# Patient Record
Sex: Male | Born: 1960 | Race: White | Hispanic: No | Marital: Married | Smoking: Never smoker
Health system: Southern US, Community
[De-identification: ages and names within clinical notes are randomized; demographics above are authoritative.]

## PROBLEM LIST (undated history)

## (undated) DIAGNOSIS — K8020 Calculus of gallbladder without cholecystitis without obstruction: Secondary | ICD-10-CM

## (undated) DIAGNOSIS — G4730 Sleep apnea, unspecified: Secondary | ICD-10-CM

## (undated) DIAGNOSIS — T7840XA Allergy, unspecified, initial encounter: Secondary | ICD-10-CM

## (undated) DIAGNOSIS — E785 Hyperlipidemia, unspecified: Secondary | ICD-10-CM

## (undated) HISTORY — DX: Hyperlipidemia, unspecified: E78.5

## (undated) HISTORY — PX: COLONOSCOPY W/ POLYPECTOMY: SHX1380

## (undated) HISTORY — DX: Allergy, unspecified, initial encounter: T78.40XA

## (undated) HISTORY — PX: COLONOSCOPY: SHX174

## (undated) HISTORY — DX: Sleep apnea, unspecified: G47.30

---

## 2008-11-06 ENCOUNTER — Ambulatory Visit: Payer: Self-pay | Admitting: Internal Medicine

## 2008-11-06 DIAGNOSIS — K921 Melena: Secondary | ICD-10-CM

## 2008-11-06 LAB — CONVERTED CEMR LAB
ALT: 17 units/L (ref 0–53)
Alkaline Phosphatase: 33 units/L — ABNORMAL LOW (ref 39–117)
Basophils Relative: 0.2 % (ref 0.0–3.0)
Bilirubin, Direct: 0 mg/dL (ref 0.0–0.3)
Calcium: 9.3 mg/dL (ref 8.4–10.5)
Chloride: 104 meq/L (ref 96–112)
Creatinine, Ser: 1.2 mg/dL (ref 0.4–1.5)
Eosinophils Relative: 1.4 % (ref 0.0–5.0)
Lymphocytes Relative: 20.4 % (ref 12.0–46.0)
Monocytes Relative: 6.5 % (ref 3.0–12.0)
Neutrophils Relative %: 71.5 % (ref 43.0–77.0)
PSA: 0.69 ng/mL (ref 0.10–4.00)
RBC: 4.86 M/uL (ref 4.22–5.81)
Total Protein: 7.2 g/dL (ref 6.0–8.3)
WBC: 5.6 10*3/uL (ref 4.5–10.5)

## 2008-11-19 ENCOUNTER — Ambulatory Visit: Payer: Self-pay | Admitting: Gastroenterology

## 2008-12-08 ENCOUNTER — Ambulatory Visit: Payer: Self-pay | Admitting: Internal Medicine

## 2008-12-08 ENCOUNTER — Encounter: Payer: Self-pay | Admitting: Internal Medicine

## 2008-12-10 ENCOUNTER — Encounter: Payer: Self-pay | Admitting: Internal Medicine

## 2012-12-25 ENCOUNTER — Other Ambulatory Visit: Payer: Self-pay | Admitting: Nurse Practitioner

## 2012-12-25 ENCOUNTER — Ambulatory Visit
Admission: RE | Admit: 2012-12-25 | Discharge: 2012-12-25 | Disposition: A | Discharge status: Home / Self Care | Payer: BC Managed Care – PPO | Source: Ambulatory Visit | Attending: Nurse Practitioner | Admitting: Nurse Practitioner

## 2012-12-25 DIAGNOSIS — M79671 Pain in right foot: Secondary | ICD-10-CM

## 2013-10-23 ENCOUNTER — Encounter: Payer: Self-pay | Admitting: Internal Medicine

## 2013-12-18 ENCOUNTER — Other Ambulatory Visit (INDEPENDENT_AMBULATORY_CARE_PROVIDER_SITE_OTHER): Payer: BC Managed Care – PPO

## 2013-12-18 DIAGNOSIS — Z0000 Encounter for general adult medical examination without abnormal findings: Secondary | ICD-10-CM

## 2013-12-18 LAB — BASIC METABOLIC PANEL
BUN: 22 mg/dL (ref 6–23)
CALCIUM: 10 mg/dL (ref 8.4–10.5)
CO2: 30 mEq/L (ref 19–32)
Chloride: 104 mEq/L (ref 96–112)
Creatinine, Ser: 1.2 mg/dL (ref 0.4–1.5)
GFR: 68.61 mL/min (ref >60.00)
GLUCOSE: 97 mg/dL (ref 70–99)
POTASSIUM: 5 meq/L (ref 3.5–5.1)
SODIUM: 141 meq/L (ref 135–145)

## 2013-12-18 LAB — POCT URINALYSIS DIPSTICK
Bilirubin, UA: NEGATIVE
Glucose, UA: NEGATIVE
Ketones, UA: NEGATIVE
Leukocytes, UA: NEGATIVE
NITRITE UA: NEGATIVE
RBC UA: NEGATIVE
Spec Grav, UA: 1.02
UROBILINOGEN UA: 0.2
pH, UA: 7.5

## 2013-12-18 LAB — CBC WITH DIFFERENTIAL/PLATELET
BASOS ABS: 0 10*3/uL (ref 0.0–0.1)
Basophils Relative: 0.7 % (ref 0.0–3.0)
EOS PCT: 3.1 % (ref 0.0–5.0)
Eosinophils Absolute: 0.2 10*3/uL (ref 0.0–0.7)
HCT: 48.7 % (ref 39.0–52.0)
HEMOGLOBIN: 16.4 g/dL (ref 13.0–17.0)
LYMPHS PCT: 24.2 % (ref 12.0–46.0)
Lymphs Abs: 1.4 10*3/uL (ref 0.7–4.0)
MCHC: 33.7 g/dL (ref 30.0–36.0)
MCV: 89 fl (ref 78.0–100.0)
MONOS PCT: 8.5 % (ref 3.0–12.0)
Monocytes Absolute: 0.5 10*3/uL (ref 0.1–1.0)
NEUTROS ABS: 3.8 10*3/uL (ref 1.4–7.7)
Neutrophils Relative %: 63.5 % (ref 43.0–77.0)
PLATELETS: 202 10*3/uL (ref 150.0–400.0)
RBC: 5.48 Mil/uL (ref 4.22–5.81)
RDW: 13.8 % (ref 11.5–15.5)
WBC: 6 10*3/uL (ref 4.0–10.5)

## 2013-12-18 LAB — TSH: TSH: 1.33 u[IU]/mL (ref 0.35–4.50)

## 2013-12-18 LAB — HEPATIC FUNCTION PANEL
ALK PHOS: 37 U/L — AB (ref 39–117)
ALT: 23 U/L (ref 0–53)
AST: 32 U/L (ref 0–37)
Albumin: 4.9 g/dL (ref 3.5–5.2)
BILIRUBIN TOTAL: 0.8 mg/dL (ref 0.2–1.2)
Bilirubin, Direct: 0.1 mg/dL (ref 0.0–0.3)
Total Protein: 7.4 g/dL (ref 6.0–8.3)

## 2013-12-18 LAB — LIPID PANEL
CHOL/HDL RATIO: 5
Cholesterol: 201 mg/dL — ABNORMAL HIGH (ref 0–200)
HDL: 43.6 mg/dL (ref >39.00)
LDL CALC: 136 mg/dL — AB (ref 0–99)
NonHDL: 157.4
Triglycerides: 108 mg/dL (ref 0.0–149.0)
VLDL: 21.6 mg/dL (ref 0.0–40.0)

## 2013-12-18 LAB — PSA: PSA: 1.06 ng/mL (ref 0.10–4.00)

## 2013-12-25 ENCOUNTER — Ambulatory Visit (INDEPENDENT_AMBULATORY_CARE_PROVIDER_SITE_OTHER): Payer: BC Managed Care – PPO | Admitting: Internal Medicine

## 2013-12-25 ENCOUNTER — Encounter: Payer: Self-pay | Admitting: Internal Medicine

## 2013-12-25 VITALS — BP 132/80 | HR 72 | Temp 97.9°F | Ht 70.5 in | Wt 201.0 lb

## 2013-12-25 DIAGNOSIS — R030 Elevated blood-pressure reading, without diagnosis of hypertension: Secondary | ICD-10-CM | POA: Insufficient documentation

## 2013-12-25 DIAGNOSIS — Z23 Encounter for immunization: Secondary | ICD-10-CM

## 2013-12-25 DIAGNOSIS — Z0000 Encounter for general adult medical examination without abnormal findings: Secondary | ICD-10-CM | POA: Insufficient documentation

## 2013-12-25 DIAGNOSIS — L989 Disorder of the skin and subcutaneous tissue, unspecified: Secondary | ICD-10-CM | POA: Insufficient documentation

## 2013-12-25 NOTE — Assessment & Plan Note (Addendum)
Reviewed adult health maintenance protocols.  Patient has history of colon polyps. Previous colonoscopy performed by Dr. Henrene Pastor. He is due for surveillance colonoscopy this year. Patient updated with Tdap.  Patient to inquire with his insurance company about shingles vaccine.  We discussed pros and cons of prostate cancer screening. His digital rectal exam was unremarkable other than mild enlargement of his prostate gland.  Repeat screening in 2 years. Lab Results  Component Value Date   PSA 1.06 12/18/2013   PSA 0.69 11/06/2008     Patient encouraged to follow low saturated fat diet. Handout provided.  Also encouraged mild weight loss.

## 2013-12-25 NOTE — Assessment & Plan Note (Signed)
Patient's blood pressure in right arm with manual cuff is 140/80 and 132/80 in left arm. Patient advised to continue monitoring blood pressure at home. I encouraged low salt diet. Patient already exercises on a regular basis. BP: 132/80 mmHg

## 2013-12-25 NOTE — Assessment & Plan Note (Signed)
Patient has history of chronic sun exposure.  He is a runner.  He has a hypopigmented skin lesion on left cheek. Refer to dermatology for further evaluation.

## 2013-12-25 NOTE — Patient Instructions (Addendum)
Monitor your blood pressure at home as directed and bring blood pressure log to your next follow up appointment. See attached low saturated fat diet handout. Ask your insurance company about shingles vaccine

## 2013-12-25 NOTE — Progress Notes (Signed)
Subjective:    Patient ID: Aaron Donovan, male    DOB: [birth year], 53 y.o.   MRN: [id number]  HPI  53 year old white male for routine physical. Patient denies any significant interval medical history. His last colonoscopy was in 2010 with Dr. Henrene Pastor. Patient scheduled to have followup colonoscopy this year.  Patient reports he has been occasionally monitoring his blood pressure at his local pharmacy. His systolic blood pressure has frequently been elevated above 140. He is asymptomatic.  Screening bloodwork reviewed with patient in detail.  Review of Systems  Constitutional: Negative for activity change, appetite change and unexpected weight change.  Eyes: Negative for visual disturbance.  Ears: no hearing loss Respiratory: Negative for cough, chest tightness and shortness of breath.   Cardiovascular: Negative for chest pain.  Genitourinary: Negative for difficulty urinating. nocturia x 1 Neurological: Negative for headaches.  Gastrointestinal: Negative for abdominal pain, heartburn melena or hematochezia Psych: Negative for depression or anxiety Endo:  Negative for ED or sexual dysfunction       History reviewed. No pertinent past medical history.  History   Social History  . Marital Status: Married    Spouse Name: N/A    Number of Children: N/A  . Years of Education: N/A   Occupational History  .      Controller at [employer]   Social History Main Topics  . Smoking status: Never Smoker   . Smokeless tobacco: Not on file  . Alcohol Use: 1.0 oz/week    2 drink(s) per week  . Drug Use: No  . Sexual Activity: Not on file   Other Topics Concern  . Not on file   Social History Narrative   Teacher, music, B.S. Degree   3 children (2 daugthers 55, 60.  1 son 43)    History reviewed. No pertinent past surgical history.  Family History  Problem Relation Age of Onset  . Rheumatic fever Father 26    Died from complications of rheumatic valvular heart disease  .  Diabetes Mellitus II Mother   . Hypertension Mother   . Obesity Brother   . Diabetes Mellitus II Sister   . Hypertension Sister   . Colon cancer Neg Hx   . Prostate cancer Neg Hx     Allergies no known allergies  No current outpatient prescriptions on file prior to visit.   No current facility-administered medications on file prior to visit.    BP 132/80  Pulse 72  Temp(Src) 97.9 F (36.6 C) (Oral)  Ht 5' 10.5" (1.791 m)  Wt 201 lb (91.173 kg)  BMI 28.42 kg/m2      Objective:   Physical Exam  Constitutional: He is oriented to person, place, and time. He appears well-developed and well-nourished. No distress.  HENT:  Head: Normocephalic and atraumatic.  Right Ear: External ear normal.  Left Ear: External ear normal.  Mouth/Throat: Oropharynx is clear and moist.  Hearing is grossly normal  Eyes: EOM are normal. Pupils are equal, round, and reactive to light. No scleral icterus.  Neck: Normal range of motion. Neck supple. No thyromegaly present.  No carotid bruit  Cardiovascular: Normal rate, regular rhythm, normal heart sounds and intact distal pulses.   No murmur heard. Pulmonary/Chest: Effort normal and breath sounds normal. He has no wheezes.  Abdominal: Soft. Bowel sounds are normal. There is no tenderness.  Genitourinary: Rectum normal.  Slightly enlarged prostate without nodules or asymmetry  Musculoskeletal: Normal range of motion. He exhibits no edema.  Lymphadenopathy:  He has no cervical adenopathy.  Neurological: He is alert and oriented to person, place, and time. No cranial nerve deficit.  Skin: Skin is warm and dry.  3-4 mm hypopigmented skin lesion left cheek  Psychiatric: He has a normal mood and affect. His behavior is normal.          Assessment & Plan:

## 2014-04-29 ENCOUNTER — Other Ambulatory Visit: Payer: Self-pay | Admitting: Dermatology

## 2014-10-16 ENCOUNTER — Encounter: Payer: Self-pay | Admitting: Internal Medicine

## 2014-11-27 ENCOUNTER — Encounter: Payer: Self-pay | Admitting: Internal Medicine

## 2015-08-13 ENCOUNTER — Encounter: Payer: Self-pay | Admitting: Internal Medicine

## 2015-10-09 ENCOUNTER — Ambulatory Visit (AMBULATORY_SURGERY_CENTER): Payer: Self-pay

## 2015-10-09 VITALS — Ht 72.0 in | Wt 206.8 lb

## 2015-10-09 DIAGNOSIS — Z860100 Personal history of colon polyps, unspecified: Secondary | ICD-10-CM

## 2015-10-09 MED ORDER — SUPREP BOWEL PREP KIT 17.5-3.13-1.6 GM/177ML PO SOLN: 1.0000 | Freq: Once | ORAL | Status: DC

## 2015-10-09 NOTE — Progress Notes (Signed)
No allergies to eggs or soy No home oxygen No past problems with anesthesia No diet/weight loss meds  Has email and internet; refused emmi; has had test before

## 2015-10-14 ENCOUNTER — Telehealth: Payer: Self-pay | Admitting: Internal Medicine

## 2015-10-14 NOTE — Telephone Encounter (Signed)
Information called to pharmacy [phone]   Matalyn Nawaz/PV

## 2015-10-23 ENCOUNTER — Encounter: Payer: Self-pay | Admitting: Internal Medicine

## 2015-10-23 ENCOUNTER — Ambulatory Visit (AMBULATORY_SURGERY_CENTER): Payer: BLUE CROSS/BLUE SHIELD | Admitting: Internal Medicine

## 2015-10-23 VITALS — BP 119/72 | HR 52 | Temp 98.7°F | Resp 17 | Ht 72.0 in | Wt 206.0 lb

## 2015-10-23 DIAGNOSIS — Z86010 Personal history of colonic polyps: Secondary | ICD-10-CM | POA: Diagnosis not present

## 2015-10-23 DIAGNOSIS — K621 Rectal polyp: Secondary | ICD-10-CM

## 2015-10-23 DIAGNOSIS — D128 Benign neoplasm of rectum: Secondary | ICD-10-CM | POA: Diagnosis not present

## 2015-10-23 MED ORDER — SODIUM CHLORIDE 0.9 % IV SOLN
500.0000 mL | INTRAVENOUS | Status: DC
Start: 2015-10-23 — End: 2015-10-23

## 2015-10-23 NOTE — Progress Notes (Signed)
Called to room to assist during endoscopic procedure.  Patient ID and intended procedure confirmed with present staff. Received instructions for my participation in the procedure from the performing physician.  

## 2015-10-23 NOTE — Progress Notes (Signed)
  [facility] Post-op Note  Patient: Aaron Donovan  Procedure(s) Performed: colonoscopy  Patient Location: [facility] - Recovery Area  Anesthesia Type: Deep Sedation/Propofol  Level of Consciousness: awake, oriented and patient cooperative  Airway and Oxygen Therapy: Patient Spontanous Breathing  Post-op Pain: none  Post-op Assessment:  Post-op Vital signs reviewed, Patient's Cardiovascular Status Stable, Respiratory Function Stable, Patent Airway, No signs of Nausea or vomiting and Pain level controlled  Post-op Vital Signs: Reviewed and stable  Complications: No apparent anesthesia complications  Nomie Buchberger E 11:54 AM

## 2015-10-23 NOTE — Op Note (Signed)
Aaron Donovan: Aaron Donovan Procedure Date: 10/23/2015 11:21 AM MRN: [id number] Endoscopist: Docia Chuck. Aaron Donovan , MD Age: 55 Date of Birth: 12-08-1960 Gender: Male Procedure:                Colonoscopy, with snare polypectomy x 2 Indications:              High risk colon cancer surveillance: Personal                            history of non-advanced adenoma. Index examination                            June 2010 with 2 diminutive adenomas Medicines:                Monitored Anesthesia Care Procedure:                Pre-Anesthesia Assessment:                           - Prior to the procedure, a History and Physical                            was performed, and patient medications and                            allergies were reviewed. The patient's tolerance of                            previous anesthesia was also reviewed. The risks                            and benefits of the procedure and the sedation                            options and risks were discussed with the patient.                            All questions were answered, and informed consent                            was obtained. Prior Anticoagulants: The patient has                            taken no previous anticoagulant or antiplatelet                            agents. ASA Grade Assessment: II - A patient with                            mild systemic disease. After reviewing the risks                            and benefits, the patient was deemed in  satisfactory condition to undergo the procedure.                           After obtaining informed consent, the colonoscope                            was passed under direct vision. Throughout the                            procedure, the patient's blood pressure, pulse, and                            oxygen saturations were monitored continuously. The                            Model CF-HQ190L [id number]) scope  was introduced                            through the anus and advanced to the the cecum,                            identified by appendiceal orifice and ileocecal                            valve. The ileocecal valve, appendiceal orifice,                            and rectum were photographed. The quality of the                            bowel preparation was excellent. The colonoscopy                            was performed without difficulty. The patient                            tolerated the procedure well. The bowel preparation                            used was SUPREP. Scope In: 11:34:15 AM Scope Out: 11:48:38 AM Scope Withdrawal Time: 0 hours 12 minutes 49 seconds  Total Procedure Duration: 0 hours 14 minutes 23 seconds  Findings:                 Three polyps were found in the rectum. The polyps                            were 2 to 3 mm in size. These polyps were removed                            with a cold snare. Resection and retrieval were                            complete.  Internal hemorrhoids were found during retroflexion.                           The exam was otherwise without abnormality on                            direct and retroflexion views. Complications:            No immediate complications. Estimated blood loss:                            None. Estimated Blood Loss:     Estimated blood loss: none. Impression:               - Three 2 to 3 mm polyps in the rectum, removed                            with a cold snare. Resected and retrieved.                           - Internal hemorrhoids.                           - The examination was otherwise normal on direct                            and retroflexion views. Recommendation:           - Repeat colonoscopy in 5 years for surveillance.                           - Patient has a contact number available for                            emergencies. The signs and symptoms of  potential                            delayed complications were discussed with the                            patient. Return to normal activities tomorrow.                            Written discharge instructions were provided to the                            patient.                           - Resume previous diet.                           - Continue present medications.                           - Await pathology results. Docia Chuck. Aaron Pastor, MD 10/23/2015 11:54:14 AM This report has been signed electronically. CC  Letter to:             Doe-hyun R. Shawna Orleans

## 2015-10-23 NOTE — Patient Instructions (Addendum)
YOU HAD AN ENDOSCOPIC PROCEDURE TODAY AT [facility]:   Refer to the procedure report that was given to you for any specific questions about what was found during the examination.  If the procedure report does not answer your questions, please call your gastroenterologist to clarify.  If you requested that your care partner not be given the details of your procedure findings, then the procedure report has been included in a sealed envelope for you to review at your convenience later.  YOU SHOULD EXPECT: Some feelings of bloating in the abdomen. Passage of more gas than usual.  Walking can help get rid of the air that was put into your GI tract during the procedure and reduce the bloating. If you had a lower endoscopy (such as a colonoscopy or flexible sigmoidoscopy) you may notice spotting of blood in your stool or on the toilet paper. If you underwent a bowel prep for your procedure, you may not have a normal bowel movement for a few days.  Please Note:  You might notice some irritation and congestion in your nose or some drainage.  This is from the oxygen used during your procedure.  There is no need for concern and it should clear up in a day or so.  SYMPTOMS TO REPORT IMMEDIATELY:   Following lower endoscopy (colonoscopy or flexible sigmoidoscopy):  Excessive amounts of blood in the stool  Significant tenderness or worsening of abdominal pains  Swelling of the abdomen that is new, acute  Fever of 100F or higher  For urgent or emergent issues, a gastroenterologist can be reached at any hour by calling [phone].   DIET: Your first meal following the procedure should be a small meal and then it is ok to progress to your normal diet. Heavy or fried foods are harder to digest and may make you feel nauseous or bloated.  Likewise, meals heavy in dairy and vegetables can increase bloating.  Drink plenty of fluids but you should avoid alcoholic beverages for 24  hours.  ACTIVITY:  You should plan to take it easy for the rest of today and you should NOT DRIVE or use heavy machinery until tomorrow (because of the sedation medicines used during the test).    FOLLOW UP: Our staff will call the number listed on your records the next business day following your procedure to check on you and address any questions or concerns that you may have regarding the information given to you following your procedure. If we do not reach you, we will leave a message.  However, if you are feeling well and you are not experiencing any problems, there is no need to return our call.  We will assume that you have returned to your regular daily activities without incident.  If any biopsies were taken you will be contacted by phone or by letter within the next 1-3 weeks.  Please call us at [phone] if you have not heard about the biopsies in 3 weeks.    SIGNATURES/CONFIDENTIALITY: You and/or your care partner have signed paperwork which will be entered into your electronic medical record.  These signatures attest to the fact that that the information above on your After Visit Summary has been reviewed and is understood.  Full responsibility of the confidentiality of this discharge information lies with you and/or your care-partner.  Please review polyp and hemorrhoid handouts provided. Await pathology results. Next colonoscopy in 5 years.

## 2015-10-26 ENCOUNTER — Telehealth: Payer: Self-pay | Admitting: *Deleted

## 2015-10-26 NOTE — Telephone Encounter (Signed)
  Follow up Call-  Call back number 10/23/2015  Post procedure Call Back phone  # [phone]  Permission to leave phone message Yes     Patient questions:  Do you have a fever, pain , or abdominal swelling? No. Pain Score  0 *  Have you tolerated food without any problems? Yes.    Have you been able to return to your normal activities? Yes.    Do you have any questions about your discharge instructions: Diet   No. Medications  No. Follow up visit  No.  Do you have questions or concerns about your Care? No.  Actions: * If pain score is 4 or above: No action needed, pain <4.

## 2015-11-06 ENCOUNTER — Encounter: Payer: Self-pay | Admitting: Internal Medicine

## 2019-04-03 ENCOUNTER — Encounter (HOSPITAL_COMMUNITY): Payer: Self-pay | Admitting: Emergency Medicine

## 2019-04-03 ENCOUNTER — Emergency Department (HOSPITAL_COMMUNITY): Payer: BC Managed Care – PPO

## 2019-04-03 ENCOUNTER — Emergency Department (HOSPITAL_COMMUNITY)
Admission: EM | Admit: 2019-04-03 | Discharge: 2019-04-03 | Discharge status: Against Medical Advice | Payer: BC Managed Care – PPO | Attending: Emergency Medicine | Admitting: Emergency Medicine

## 2019-04-03 ENCOUNTER — Other Ambulatory Visit: Payer: Self-pay

## 2019-04-03 DIAGNOSIS — R1013 Epigastric pain: Secondary | ICD-10-CM | POA: Diagnosis present

## 2019-04-03 DIAGNOSIS — Z5321 Procedure and treatment not carried out due to patient leaving prior to being seen by health care provider: Secondary | ICD-10-CM | POA: Diagnosis not present

## 2019-04-03 LAB — CBC
HCT: 46.1 % (ref 39.0–52.0)
Hemoglobin: 15.5 g/dL (ref 13.0–17.0)
MCH: 29.9 pg (ref 26.0–34.0)
MCHC: 33.6 g/dL (ref 30.0–36.0)
MCV: 88.8 fL (ref 80.0–100.0)
Platelets: 216 10*3/uL (ref 150–400)
RBC: 5.19 MIL/uL (ref 4.22–5.81)
RDW: 12.9 % (ref 11.5–15.5)
WBC: 10.6 10*3/uL — ABNORMAL HIGH (ref 4.0–10.5)
nRBC: 0 % (ref 0.0–0.2)

## 2019-04-03 LAB — COMPREHENSIVE METABOLIC PANEL
ALT: 33 U/L (ref 0–44)
AST: 34 U/L (ref 15–41)
Albumin: 4.2 g/dL (ref 3.5–5.0)
Alkaline Phosphatase: 39 U/L (ref 38–126)
Anion gap: 13 (ref 5–15)
BUN: 16 mg/dL (ref 6–20)
CO2: 23 mmol/L (ref 22–32)
Calcium: 9.3 mg/dL (ref 8.9–10.3)
Chloride: 100 mmol/L (ref 98–111)
Creatinine, Ser: 1.28 mg/dL — ABNORMAL HIGH (ref 0.61–1.24)
GFR calc Af Amer: >60 mL/min (ref >60)
GFR calc non Af Amer: >60 mL/min (ref >60)
Glucose, Bld: 131 mg/dL — ABNORMAL HIGH (ref 70–99)
Potassium: 4.1 mmol/L (ref 3.5–5.1)
Sodium: 136 mmol/L (ref 135–145)
Total Bilirubin: 0.6 mg/dL (ref 0.3–1.2)
Total Protein: 7 g/dL (ref 6.5–8.1)

## 2019-04-03 LAB — LIPASE, BLOOD: Lipase: 25 U/L (ref 11–51)

## 2019-04-03 LAB — TROPONIN I (HIGH SENSITIVITY): Troponin I (High Sensitivity): 4 ng/L (ref <18)

## 2019-04-03 MED ORDER — SODIUM CHLORIDE 0.9% FLUSH: 3.0000 mL | Freq: Once | INTRAVENOUS | Status: DC

## 2019-04-03 NOTE — ED Notes (Signed)
Registration informed the tech that this pt left [facility]

## 2019-04-03 NOTE — ED Triage Notes (Addendum)
Pt complaining of sharp mid upper abdominal pain that started at 9:30PM tonight.Pt denies N&V.

## 2019-04-04 ENCOUNTER — Other Ambulatory Visit: Payer: Self-pay | Admitting: Obstetrics and Gynecology

## 2019-04-04 DIAGNOSIS — R1013 Epigastric pain: Secondary | ICD-10-CM

## 2019-04-10 ENCOUNTER — Ambulatory Visit
Admission: RE | Admit: 2019-04-10 | Discharge: 2019-04-10 | Disposition: A | Discharge status: Home / Self Care | Payer: BC Managed Care – PPO | Source: Ambulatory Visit | Attending: Obstetrics and Gynecology | Admitting: Obstetrics and Gynecology

## 2019-04-10 DIAGNOSIS — R1013 Epigastric pain: Secondary | ICD-10-CM

## 2019-05-02 ENCOUNTER — Other Ambulatory Visit: Payer: Self-pay

## 2019-05-02 ENCOUNTER — Ambulatory Visit: Payer: BC Managed Care – PPO | Admitting: Internal Medicine

## 2019-05-02 ENCOUNTER — Encounter: Payer: Self-pay | Admitting: Internal Medicine

## 2019-05-02 VITALS — BP 124/68 | HR 73 | Temp 97.9°F | Ht 72.0 in | Wt 209.0 lb

## 2019-05-02 DIAGNOSIS — K8020 Calculus of gallbladder without cholecystitis without obstruction: Secondary | ICD-10-CM | POA: Diagnosis not present

## 2019-05-02 DIAGNOSIS — R1013 Epigastric pain: Secondary | ICD-10-CM

## 2019-05-02 NOTE — Patient Instructions (Signed)
You will receive a call from [facility] to schedule a consultation

## 2019-05-02 NOTE — Progress Notes (Signed)
HISTORY OF PRESENT ILLNESS:  Aaron Donovan is a pleasant 58 y.o. male with a history of adenomatous colon polyps for which she is undergone prior colonoscopy June 2010 and most recently April 2017.  He presents today for evaluation of acute epigastric pain and abnormal ultrasound imaging revealing cholelithiasis.  Patient reports that he was in his usual state of health until April 03, 2019 when he developed severe acute epigastric pain in the evening several hours after eating chili.  He describes the pain as sharp with difficulties getting comfortable.  Because of the persistent nature he presented to [facility] emergency room.  Cardiac etiologies were ruled out.  Comprehensive metabolic panel was unremarkable with normal liver tests and normal lipase.  CBC revealed mildly elevated white blood cell count of 10.6.  Otherwise normal.  Because of the extensive weight, patient was discharged home with plans for outpatient ultrasound.  Overall his pain lasted about 8 hours.  He tells me that he has had no issues since.  He does report having had a similar episode about 4 years ago.  That episode lasted 3 hours.  Abdominal ultrasound was completed April 10, 2019.  Patient was found to have a 2.1 cm gallstone with a slightly thickened and irregular gallbladder wall.  Normal common bile duct at 2.1 mm.  Patient denies reflux symptoms or dysphagia.  Good appetite.  No weight loss.  No nausea or vomiting  REVIEW OF SYSTEMS:  All non-GI ROS negative unless otherwise stated in the HPI except for sinus allergies  History reviewed. No pertinent past medical history.  Past Surgical History:  Procedure Laterality Date  . COLONOSCOPY    . COLONOSCOPY W/ POLYPECTOMY      Social History Aaron Donovan  reports that he has never smoked. He has never used smokeless tobacco. He reports current alcohol use of about 2.0 standard drinks of alcohol per week. He reports that he does not use drugs.  family history includes  Diabetes Mellitus II in his mother and sister; Hypertension in his mother and sister; Obesity in his brother; Rheumatic fever (age of onset: 46) in his father.  No Known Allergies     PHYSICAL EXAMINATION: Vital signs: BP 124/68   Pulse 73   Temp 97.9 F (36.6 C)   Ht 6' (1.829 m)   Wt 209 lb (94.8 kg)   BMI 28.35 kg/m   Constitutional: generally well-appearing, no acute distress Psychiatric: alert and oriented x3, cooperative Eyes: extraocular movements intact, anicteric, conjunctiva pink Mouth: oral pharynx moist, no lesions Neck: supple no lymphadenopathy Cardiovascular: heart regular rate and rhythm, no murmur Lungs: clear to auscultation bilaterally Abdomen: soft, nontender, nondistended, no obvious ascites, no peritoneal signs, normal bowel sounds, no organomegaly Rectal: Omitted Extremities: no clubbing, cyanosis, or lower extremity edema bilaterally Skin: no lesions on visible extremities Neuro: No focal deficits. No asterixis.    ASSESSMENT:  1.  Symptomatic cholelithiasis.  Now asymptomatic.  I reviewed the anatomy and pathophysiology of symptomatic gallstones with the patient.  I informed him that the definitive treatment is surgery. 2.  History of adenomatous colon polyps.  Surveillance up-to-date   PLAN:  1.  General surgical referral to Dr. Armandina Gemma for consideration of laparoscopic cholecystectomy. 2.  Due for routine surveillance colonoscopy around April 2022.  Interval follow-up as needed

## 2019-05-28 ENCOUNTER — Ambulatory Visit: Payer: Self-pay | Admitting: Surgery

## 2019-08-11 ENCOUNTER — Encounter (HOSPITAL_COMMUNITY): Payer: Self-pay | Admitting: Surgery

## 2019-08-11 DIAGNOSIS — K8010 Calculus of gallbladder with chronic cholecystitis without obstruction: Secondary | ICD-10-CM | POA: Diagnosis present

## 2019-08-11 NOTE — H&P (Signed)
General Surgery [facility]  Aaron Donovan DOB: 09-28-1960 Married / Language: English / Race: White Male   History of Present Illness   The patient is a 59 year old male who presents for evaluation of gall stones.  CHIEF COMPLAINT: chronic cholecystitis, cholelithiasis, biliary colic  Patient is referred by Dr. Scarlette Shorts for surgical evaluation and management of symptomatic cholelithiasis and chronic cholecystitis. Patient had a significant episode of epigastric abdominal pain in September 2020. He was evaluated in the emergency department. Cardiac assessment was negative. Patient had had a previous episode approximately 3 months earlier. Patient underwent ultrasound examination on April 10, 2019. This showed a 2.1 cm gallstone within the gallbladder. Gallbladder wall was irregular and slightly thickened. There was no signs of acute inflammation or infection. Patient has had no prior history of hepatobiliary or pancreatic disease. He denies jaundice or acholic stools. He has had nausea associated with his attacks but no emesis. He has had no prior abdominal surgery. There is a family history of gallbladder disease and the patient's mother and his sister. Both of them required cholecystectomy. Patient presents today to discuss cholecystectomy. He works at the [employer].   Past Surgical History  No pertinent past surgical history   Diagnostic Studies History Colonoscopy  1-5 years ago  Allergies No Known Drug Allergies  Allergies Reconciled   Medication History No Current Medications Medications Reconciled  Social History Alcohol use  Moderate alcohol use. Caffeine use  Coffee. No drug use  Tobacco use  Never smoker.  Family History Diabetes Mellitus  Mother, Sister. Heart Disease  Father. Heart disease in male family member before age 51   Other Problems  Cholelithiasis   Review of Systems General Not  Present- Appetite Loss, Chills, Fatigue, Fever, Night Sweats, Weight Gain and Weight Loss. Skin Not Present- Change in Wart/Mole, Dryness, Hives, Jaundice, New Lesions, Non-Healing Wounds, Rash and Ulcer. HEENT Not Present- Earache, Hearing Loss, Hoarseness, Nose Bleed, Oral Ulcers, Ringing in the Ears, Seasonal Allergies, Sinus Pain, Sore Throat, Visual Disturbances, Wears glasses/contact lenses and Yellow Eyes. Respiratory Present- Snoring. Not Present- Bloody sputum, Chronic Cough, Difficulty Breathing and Wheezing. Breast Not Present- Breast Mass, Breast Pain, Nipple Discharge and Skin Changes. Cardiovascular Not Present- Chest Pain, Difficulty Breathing Lying Down, Leg Cramps, Palpitations, Rapid Heart Rate, Shortness of Breath and Swelling of Extremities. Gastrointestinal Not Present- Abdominal Pain, Bloating, Bloody Stool, Change in Bowel Habits, Chronic diarrhea, Constipation, Difficulty Swallowing, Excessive gas, Gets full quickly at meals, Hemorrhoids, Indigestion, Nausea, Rectal Pain and Vomiting. Male Genitourinary Not Present- Blood in Urine, Change in Urinary Stream, Frequency, Impotence, Nocturia, Painful Urination, Urgency and Urine Leakage. Musculoskeletal Not Present- Back Pain, Joint Pain, Joint Stiffness, Muscle Pain, Muscle Weakness and Swelling of Extremities. Neurological Not Present- Decreased Memory, Fainting, Headaches, Numbness, Seizures, Tingling, Tremor, Trouble walking and Weakness. Psychiatric Not Present- Anxiety, Bipolar, Change in Sleep Pattern, Depression, Fearful and Frequent crying. Endocrine Not Present- Cold Intolerance, Excessive Hunger, Hair Changes, Heat Intolerance, Hot flashes and New Diabetes. Hematology Not Present- Blood Thinners, Easy Bruising, Excessive bleeding, Gland problems, HIV and Persistent Infections.  Vitals Weight: 210 lb Height: 72in Body Surface Area: 2.18 m Body Mass Index: 28.48 kg/m  Temp.: 97.95F(Thermal Scan)  Pulse: 72  (Regular)  BP: 132/72 (Sitting, Left Arm, Standard)  Physical Exam  GENERAL APPEARANCE Development: normal Nutritional status: normal Gross deformities: none  SKIN Rash, lesions, ulcers: none Induration, erythema: none Nodules: none palpable  EYES Conjunctiva and lids: normal Pupils:  equal and reactive Iris: normal bilaterally  EARS, NOSE, MOUTH, THROAT External ears: no lesion or deformity External nose: no lesion or deformity Hearing: grossly normal Patient is wearing a mask.  NECK Symmetric: yes Trachea: midline Thyroid: no palpable nodules in the thyroid bed  CHEST Respiratory effort: normal Retraction or accessory muscle use: no Breath sounds: normal bilaterally Rales, rhonchi, wheeze: none  CARDIOVASCULAR Auscultation: regular rhythm, normal rate Murmurs: none Pulses: carotid and radial pulse 2+ palpable Lower extremity edema: none Lower extremity varicosities: none  ABDOMEN Distension: none Masses: none palpable Tenderness: none Hepatosplenomegaly: not present Hernia: not present  MUSCULOSKELETAL Station and gait: normal Digits and nails: no clubbing or cyanosis Muscle strength: grossly normal all extremities Range of motion: grossly normal all extremities Deformity: none  LYMPHATIC Cervical: none palpable Supraclavicular: none palpable  PSYCHIATRIC Oriented to person, place, and time: yes Mood and affect: normal for situation Judgment and insight: appropriate for situation    Assessment & Plan  CALCULUS OF GALLBLADDER WITH CHRONIC CHOLECYSTITIS WITHOUT OBSTRUCTION (K80.10)  Pt Education - Pamphlet Given - Laparoscopic Gallbladder Surgery: discussed with patient and provided information.  Patient is referred by his gastroenterologist, Dr. Scarlette Shorts, for surgical evaluation and management of symptomatic cholelithiasis, chronic cholecystitis, and biliary colic. Patient is provided with written literature on gallbladder surgery to  review at home.  Patient has had repeated episodes of acute biliary colic. Ultrasound demonstrates a 2.1 cm gallstone with chronic inflammatory changes. I have recommended proceeding with laparoscopic cholecystectomy with intraoperative cholangiography. We discussed the risk and benefits of the procedure. Discussed the potential for conversion to open surgery. We discussed the hospital stay to be anticipated as well as the postoperative recovery and return to work and activities. The patient understands and wishes to proceed with surgery in the near future.  The risks and benefits of the procedure have been discussed at length with the patient. The patient understands the proposed procedure, potential alternative treatments, and the course of recovery to be expected. All of the patient's questions have been answered at this time. The patient wishes to proceed with surgery.  Armandina Gemma, MD [facility] Office: [phone]

## 2019-08-16 ENCOUNTER — Encounter (HOSPITAL_COMMUNITY): Payer: Self-pay

## 2019-08-16 ENCOUNTER — Encounter (HOSPITAL_COMMUNITY)
Admission: RE | Admit: 2019-08-16 | Discharge: 2019-08-16 | Disposition: A | Discharge status: Home / Self Care | Payer: BC Managed Care – PPO | Source: Ambulatory Visit | Attending: Surgery | Admitting: Surgery

## 2019-08-16 ENCOUNTER — Other Ambulatory Visit: Payer: Self-pay

## 2019-08-16 DIAGNOSIS — Z01812 Encounter for preprocedural laboratory examination: Secondary | ICD-10-CM | POA: Diagnosis not present

## 2019-08-16 DIAGNOSIS — Z20822 Contact with and (suspected) exposure to covid-19: Secondary | ICD-10-CM | POA: Diagnosis not present

## 2019-08-16 DIAGNOSIS — Z01818 Encounter for other preprocedural examination: Secondary | ICD-10-CM | POA: Insufficient documentation

## 2019-08-16 NOTE — Patient Instructions (Addendum)
DUE TO COVID-19 ONLY ONE VISITOR IS ALLOWED TO COME WITH YOU AND STAY IN THE WAITING ROOM ONLY DURING PRE OP AND PROCEDURE DAY OF SURGERY. THE 1 VISITOR MAY VISIT WITH YOU AFTER SURGERY IN YOUR PRIVATE ROOM DURING VISITING HOURS ONLY!  YOU NEED TO HAVE A COVID 19 TEST ON_02/15/2021______ @___310  pm____, THIS TEST MUST BE DONE BEFORE SURGERY, COME  [address] [place] , [id number].  ([place]) ONCE YOUR COVID TEST IS COMPLETED, PLEASE BEGIN THE QUARANTINE INSTRUCTIONS AS OUTLINED IN YOUR HANDOUT.                Aaron Donovan    Your procedure is scheduled on: Thursday 08/22/2019   Report to [facility] Main  Entrance    Report to  Short Stay  at   [place]   AM     Call this number if you have problems the morning of surgery [phone]    Remember: Do not eat food or drink liquids :After Midnight.     BRUSH YOUR TEETH MORNING OF SURGERY AND RINSE YOUR MOUTH OUT, NO CHEWING GUM CANDY OR MINTS.     Take these medicines the morning of surgery with A SIP OF WATER: none                                 You may not have any metal on your body including hair pins and              piercings  Do not wear jewelry, make-up, lotions, powders or perfumes, deodorant                          Men may shave face and neck.   Do not bring valuables to the hospital. [place].  Contacts, dentures or bridgework may not be worn into surgery.  Leave suitcase in the car. After surgery it may be brought to your room.     Patients discharged the day of surgery will not be allowed to drive home. IF YOU ARE HAVING SURGERY AND GOING HOME THE SAME DAY, YOU MUST HAVE AN ADULT TO DRIVE YOU HOME AND  BE WITH YOU FOR 24 HOURS. YOU MAY GO HOME BY TAXI OR UBER OR ORTHERWISE, BUT AN ADULT MUST ACCOMPANY YOU HOME AND STAY WITH YOU FOR 24 HOURS.  Name and phone number of your driver:spouse- Aaron Donovan  [phone]                 Please read over the following fact sheets you were given: _____________________________________________________________________             [facility] - Preparing for Surgery Before surgery, you can play an important role.  Because skin is not sterile, your skin needs to be as free of germs as possible.  You can reduce the number of germs on your skin by washing with CHG (chlorahexidine gluconate) soap before surgery.  CHG is an antiseptic cleaner which kills germs and bonds with the skin to continue killing germs even after washing. Please DO NOT use if you have an allergy to CHG or antibacterial soaps.  If your skin becomes reddened/irritated stop using the CHG and inform your nurse when you arrive at Short Stay. Do not shave (including legs  and underarms) for at least 48 hours prior to the first CHG shower.  You may shave your face/neck. Please follow these instructions carefully:  1.  Shower with CHG Soap the night before surgery and the  morning of Surgery.  2.  If you choose to wash your hair, wash your hair first as usual with your  normal  shampoo.  3.  After you shampoo, rinse your hair and body thoroughly to remove the  shampoo.                           4.  Use CHG as you would any other liquid soap.  You can apply chg directly  to the skin and wash                       Gently with a scrungie or clean washcloth.  5.  Apply the CHG Soap to your body ONLY FROM THE NECK DOWN.   Do not use on face/ open                           Wound or open sores. Avoid contact with eyes, ears mouth and genitals (private parts).                       Wash face,  Genitals (private parts) with your normal soap.             6.  Wash thoroughly, paying special attention to the area where your surgery  will be performed.  7.  Thoroughly rinse your body with warm water from the neck down.  8.  DO NOT shower/wash with your normal soap after using and rinsing off  the CHG Soap.                9.  Pat  yourself dry with a clean towel.            10.  Wear clean pajamas.            11.  Place clean sheets on your bed the night of your first shower and do not  sleep with pets. Day of Surgery : Do not apply any lotions/deodorants the morning of surgery.  Please wear clean clothes to the hospital/surgery center.  FAILURE TO FOLLOW THESE INSTRUCTIONS MAY RESULT IN THE CANCELLATION OF YOUR SURGERY PATIENT SIGNATURE_________________________________  NURSE SIGNATURE__________________________________  ________________________________________________________________________

## 2019-08-16 NOTE — Patient Instructions (Signed)
DUE TO COVID-19 ONLY ONE VISITOR IS ALLOWED TO COME WITH YOU AND STAY IN THE WAITING ROOM ONLY DURING PRE OP AND PROCEDURE DAY OF SURGERY. THE 1 VISITOR MAY VISIT WITH YOU AFTER SURGERY IN YOUR PRIVATE ROOM DURING VISITING HOURS ONLY!  YOU NEED TO HAVE A COVID 19 TEST ON_______ @_______ , THIS TEST MUST BE DONE BEFORE SURGERY, COME  [place], [address] [address] , [address].  ([place]) ONCE YOUR COVID TEST IS COMPLETED, PLEASE BEGIN THE QUARANTINE INSTRUCTIONS AS OUTLINED IN YOUR HANDOUT.                Aaron Donovan     Your procedure is scheduled on: Thursday 08/22/2019   Report to [facility] Main  Entrance    Report to admitting at  [place]  AM     Call this number if you have problems the morning of surgery [phone]    Remember: Do not eat food or drink liquids :After Midnight.     BRUSH YOUR TEETH MORNING OF SURGERY AND RINSE YOUR MOUTH OUT, NO CHEWING GUM CANDY OR MINTS.     Take these medicines the morning of surgery with A SIP OF WATER: none                                 You may not have any metal on your body including hair pins and              piercings  Do not wear jewelry, make-up, lotions, powders or perfumes, deodorant             .              Men may shave face and neck.   Do not bring valuables to the hospital. [place].  Contacts, dentures or bridgework may not be worn into surgery.  Leave suitcase in the car. After surgery it may be brought to your room.                 Please read over the following fact sheets you were given: _____________________________________________________________________             [facility] - Preparing for Surgery Before surgery, you can play an important role.  Because skin is not sterile, your skin needs to be as free of germs as possible.  You can reduce the number of germs on your skin by washing with CHG (chlorahexidine gluconate) soap  before surgery.  CHG is an antiseptic cleaner which kills germs and bonds with the skin to continue killing germs even after washing. Please DO NOT use if you have an allergy to CHG or antibacterial soaps.  If your skin becomes reddened/irritated stop using the CHG and inform your nurse when you arrive at Short Stay. Do not shave (including legs and underarms) for at least 48 hours prior to the first CHG shower.  You may shave your face/neck. Please follow these instructions carefully:  1.  Shower with CHG Soap the night before surgery and the  morning of Surgery.  2.  If you choose to wash your hair, wash your hair first as usual with your  normal  shampoo.  3.  After you shampoo, rinse your hair and body thoroughly to remove the  shampoo.  4.  Use CHG as you would any other liquid soap.  You can apply chg directly  to the skin and wash                       Gently with a scrungie or clean washcloth.  5.  Apply the CHG Soap to your body ONLY FROM THE NECK DOWN.   Do not use on face/ open                           Wound or open sores. Avoid contact with eyes, ears mouth and genitals (private parts).                       Wash face,  Genitals (private parts) with your normal soap.             6.  Wash thoroughly, paying special attention to the area where your surgery  will be performed.  7.  Thoroughly rinse your body with warm water from the neck down.  8.  DO NOT shower/wash with your normal soap after using and rinsing off  the CHG Soap.                9.  Pat yourself dry with a clean towel.            10.  Wear clean pajamas.            11.  Place clean sheets on your bed the night of your first shower and do not  sleep with pets. Day of Surgery : Do not apply any lotions/deodorants the morning of surgery.  Please wear clean clothes to the hospital/surgery center.  FAILURE TO FOLLOW THESE INSTRUCTIONS MAY RESULT IN THE CANCELLATION OF YOUR SURGERY PATIENT  SIGNATURE_________________________________  NURSE SIGNATURE__________________________________  ________________________________________________________________________

## 2019-08-16 NOTE — Progress Notes (Signed)
PCP - Dr. Lavina Hamman Cardiologist - n/a  Chest x-ray - n/a EKG - n/a Stress Test - n/a ECHO -n/a  Cardiac Cath -n/a   Sleep Study - n/a CPAP -n/a   Fasting Blood Sugar - n/a Checks Blood Sugar __0___ times a day  Blood Thinner Instructions:n/a Aspirin Instructions:n/a Last Dose:n/a  Anesthesia review:   Patient denies shortness of breath, fever, cough and chest pain at PAT appointment   Patient verbalized understanding of instructions that were given to them at the PAT appointment. Patient was also instructed that they will need to review over the PAT instructions again at home before surgery.

## 2019-08-19 ENCOUNTER — Encounter (HOSPITAL_COMMUNITY)
Admission: RE | Admit: 2019-08-19 | Discharge: 2019-08-19 | Disposition: A | Discharge status: Home / Self Care | Payer: BC Managed Care – PPO | Source: Ambulatory Visit | Attending: Surgery | Admitting: Surgery

## 2019-08-19 ENCOUNTER — Other Ambulatory Visit (HOSPITAL_COMMUNITY)
Admission: RE | Admit: 2019-08-19 | Discharge: 2019-08-19 | Disposition: A | Discharge status: Home / Self Care | Payer: BC Managed Care – PPO | Source: Ambulatory Visit | Attending: Surgery | Admitting: Surgery

## 2019-08-19 ENCOUNTER — Other Ambulatory Visit: Payer: Self-pay

## 2019-08-19 DIAGNOSIS — Z20822 Contact with and (suspected) exposure to covid-19: Secondary | ICD-10-CM | POA: Insufficient documentation

## 2019-08-19 DIAGNOSIS — Z01812 Encounter for preprocedural laboratory examination: Secondary | ICD-10-CM | POA: Insufficient documentation

## 2019-08-19 HISTORY — DX: Calculus of gallbladder without cholecystitis without obstruction: K80.20

## 2019-08-19 LAB — CBC
HCT: 46.7 % (ref 39.0–52.0)
Hemoglobin: 15.6 g/dL (ref 13.0–17.0)
MCH: 30 pg (ref 26.0–34.0)
MCHC: 33.4 g/dL (ref 30.0–36.0)
MCV: 89.8 fL (ref 80.0–100.0)
Platelets: 207 10*3/uL (ref 150–400)
RBC: 5.2 MIL/uL (ref 4.22–5.81)
RDW: 12.9 % (ref 11.5–15.5)
WBC: 6.6 10*3/uL (ref 4.0–10.5)
nRBC: 0 % (ref 0.0–0.2)

## 2019-08-19 LAB — SARS CORONAVIRUS 2 (TAT 6-24 HRS): SARS Coronavirus 2: NEGATIVE

## 2019-08-21 ENCOUNTER — Encounter (HOSPITAL_COMMUNITY): Payer: Self-pay | Admitting: Surgery

## 2019-08-21 NOTE — Anesthesia Preprocedure Evaluation (Addendum)
Anesthesia Evaluation  Patient identified by MRN, date of birth, ID band Patient awake    Reviewed: Allergy & Precautions, NPO status , Patient's Chart, lab work & pertinent test results  History of Anesthesia Complications Negative for: history of anesthetic complications  Airway Mallampati: II  TM Distance: >3 FB Neck ROM: Full    Dental no notable dental hx.    Pulmonary neg pulmonary ROS,    Pulmonary exam normal        Cardiovascular negative cardio ROS Normal cardiovascular exam     Neuro/Psych negative neurological ROS  negative psych ROS   GI/Hepatic Neg liver ROS, chronic cholecystitis, cholelithiasis, biliary colic   Endo/Other  negative endocrine ROS  Renal/GU negative Renal ROS  negative genitourinary   Musculoskeletal negative musculoskeletal ROS (+)   Abdominal   Peds  Hematology negative hematology ROS (+)   Anesthesia Other Findings Day of surgery medications reviewed with patient.  Reproductive/Obstetrics negative OB ROS                            Anesthesia Physical Anesthesia Plan  ASA: II  Anesthesia Plan: General   Post-op Pain Management:    Induction: Intravenous  PONV Risk Score and Plan: 4 or greater and Treatment may vary due to age or medical condition, Midazolam, Ondansetron and Dexamethasone  Airway Management Planned: Oral ETT  Additional Equipment: None  Intra-op Plan:   Post-operative Plan: Extubation in OR  Informed Consent: I have reviewed the patients History and Physical, chart, labs and discussed the procedure including the risks, benefits and alternatives for the proposed anesthesia with the patient or authorized representative who has indicated his/her understanding and acceptance.     Dental advisory given  Plan Discussed with: CRNA  Anesthesia Plan Comments:        Anesthesia Quick Evaluation

## 2019-08-22 ENCOUNTER — Ambulatory Visit: Admit: 2019-08-22 | Payer: BC Managed Care – PPO | Admitting: Surgery

## 2019-08-22 ENCOUNTER — Other Ambulatory Visit: Payer: Self-pay

## 2019-08-22 ENCOUNTER — Ambulatory Visit (HOSPITAL_COMMUNITY): Payer: BC Managed Care – PPO | Admitting: Anesthesiology

## 2019-08-22 ENCOUNTER — Encounter (HOSPITAL_COMMUNITY)
Admission: RE | Disposition: A | Discharge status: Home / Self Care | Payer: Self-pay | Source: Home / Self Care | Attending: Surgery

## 2019-08-22 ENCOUNTER — Encounter (HOSPITAL_COMMUNITY): Payer: Self-pay | Admitting: Surgery

## 2019-08-22 ENCOUNTER — Ambulatory Visit (HOSPITAL_COMMUNITY): Payer: BC Managed Care – PPO

## 2019-08-22 ENCOUNTER — Ambulatory Visit (HOSPITAL_COMMUNITY): Payer: BC Managed Care – PPO | Admitting: Physician Assistant

## 2019-08-22 ENCOUNTER — Ambulatory Visit (HOSPITAL_COMMUNITY)
Admission: RE | Admit: 2019-08-22 | Discharge: 2019-08-22 | Disposition: A | Discharge status: Home / Self Care | Payer: BC Managed Care – PPO | Attending: Surgery | Admitting: Surgery

## 2019-08-22 DIAGNOSIS — K8010 Calculus of gallbladder with chronic cholecystitis without obstruction: Secondary | ICD-10-CM | POA: Insufficient documentation

## 2019-08-22 DIAGNOSIS — K8080 Other cholelithiasis without obstruction: Secondary | ICD-10-CM | POA: Diagnosis present

## 2019-08-22 DIAGNOSIS — Z419 Encounter for procedure for purposes other than remedying health state, unspecified: Secondary | ICD-10-CM

## 2019-08-22 HISTORY — PX: CHOLECYSTECTOMY: SHX55

## 2019-08-22 SURGERY — LAPAROSCOPIC CHOLECYSTECTOMY WITH INTRAOPERATIVE CHOLANGIOGRAM
Anesthesia: General

## 2019-08-22 MED ORDER — LIDOCAINE 2% (20 MG/ML) 5 ML SYRINGE
INTRAMUSCULAR | Status: AC
  Filled 2019-08-22: qty 5

## 2019-08-22 MED ORDER — CEFAZOLIN SODIUM-DEXTROSE 2-4 GM/100ML-% IV SOLN
2.0000 g | INTRAVENOUS | Status: AC
  Administered 2019-08-22: 2 g via INTRAVENOUS
  Filled 2019-08-22: qty 100

## 2019-08-22 MED ORDER — PHENYLEPHRINE 40 MCG/ML (10ML) SYRINGE FOR IV PUSH (FOR BLOOD PRESSURE SUPPORT)
PREFILLED_SYRINGE | INTRAVENOUS | Status: AC
  Filled 2019-08-22: qty 10

## 2019-08-22 MED ORDER — FENTANYL CITRATE (PF) 250 MCG/5ML IJ SOLN
INTRAMUSCULAR | Status: AC
  Filled 2019-08-22: qty 5

## 2019-08-22 MED ORDER — ROCURONIUM BROMIDE 10 MG/ML (PF) SYRINGE
PREFILLED_SYRINGE | INTRAVENOUS | Status: DC | PRN
  Administered 2019-08-22: 60 mg via INTRAVENOUS

## 2019-08-22 MED ORDER — ONDANSETRON 4 MG PO TBDP: 4.0000 mg | ORAL_TABLET | Freq: Four times a day (QID) | ORAL | Status: DC | PRN

## 2019-08-22 MED ORDER — PROPOFOL 10 MG/ML IV BOLUS
INTRAVENOUS | Status: DC | PRN
  Administered 2019-08-22: 180 mg via INTRAVENOUS

## 2019-08-22 MED ORDER — KCL IN DEXTROSE-NACL 20-5-0.45 MEQ/L-%-% IV SOLN
INTRAVENOUS | Status: DC
  Filled 2019-08-22: qty 1000

## 2019-08-22 MED ORDER — MIDAZOLAM HCL 5 MG/5ML IJ SOLN
INTRAMUSCULAR | Status: DC | PRN
  Administered 2019-08-22: 2 mg via INTRAVENOUS

## 2019-08-22 MED ORDER — EPHEDRINE 5 MG/ML INJ
INTRAVENOUS | Status: AC
  Filled 2019-08-22: qty 10

## 2019-08-22 MED ORDER — EPHEDRINE SULFATE-NACL 50-0.9 MG/10ML-% IV SOSY
PREFILLED_SYRINGE | INTRAVENOUS | Status: DC | PRN
  Administered 2019-08-22: 15 mg via INTRAVENOUS

## 2019-08-22 MED ORDER — LACTATED RINGERS IV SOLN: INTRAVENOUS | Status: DC

## 2019-08-22 MED ORDER — BUPIVACAINE-EPINEPHRINE 0.5% -1:200000 IJ SOLN
INTRAMUSCULAR | Status: AC
  Filled 2019-08-22: qty 1

## 2019-08-22 MED ORDER — 0.9 % SODIUM CHLORIDE (POUR BTL) OPTIME
TOPICAL | Status: DC | PRN
  Administered 2019-08-22: 07:00:00 1000 mL

## 2019-08-22 MED ORDER — CHLORHEXIDINE GLUCONATE CLOTH 2 % EX PADS: 6.0000 | MEDICATED_PAD | Freq: Once | CUTANEOUS | Status: DC

## 2019-08-22 MED ORDER — ONDANSETRON HCL 4 MG/2ML IJ SOLN
INTRAMUSCULAR | Status: DC | PRN
  Administered 2019-08-22: 4 mg via INTRAVENOUS

## 2019-08-22 MED ORDER — DEXAMETHASONE SODIUM PHOSPHATE 10 MG/ML IJ SOLN
INTRAMUSCULAR | Status: AC
  Filled 2019-08-22: qty 1

## 2019-08-22 MED ORDER — FENTANYL CITRATE (PF) 100 MCG/2ML IJ SOLN
INTRAMUSCULAR | Status: DC | PRN
  Administered 2019-08-22 (×2): 50 ug via INTRAVENOUS
  Administered 2019-08-22: 100 ug via INTRAVENOUS

## 2019-08-22 MED ORDER — OXYCODONE HCL 5 MG PO TABS: 5.0000 mg | ORAL_TABLET | Freq: Once | ORAL | Status: DC | PRN

## 2019-08-22 MED ORDER — PROMETHAZINE HCL 25 MG/ML IJ SOLN: 6.2500 mg | INTRAMUSCULAR | Status: DC | PRN

## 2019-08-22 MED ORDER — SUGAMMADEX SODIUM 200 MG/2ML IV SOLN
INTRAVENOUS | Status: DC | PRN
  Administered 2019-08-22: 200 mg via INTRAVENOUS

## 2019-08-22 MED ORDER — FENTANYL CITRATE (PF) 100 MCG/2ML IJ SOLN: 25.0000 ug | INTRAMUSCULAR | Status: DC | PRN

## 2019-08-22 MED ORDER — LACTATED RINGERS IR SOLN
Status: DC | PRN
  Administered 2019-08-22: 1

## 2019-08-22 MED ORDER — OXYCODONE HCL 5 MG PO TABS: 5.0000 mg | ORAL_TABLET | ORAL | Status: DC | PRN

## 2019-08-22 MED ORDER — ONDANSETRON HCL 4 MG/2ML IJ SOLN
INTRAMUSCULAR | Status: AC
  Filled 2019-08-22: qty 2

## 2019-08-22 MED ORDER — ACETAMINOPHEN 500 MG PO TABS
1000.0000 mg | ORAL_TABLET | Freq: Once | ORAL | Status: AC
  Administered 2019-08-22: 06:00:00 1000 mg via ORAL
  Filled 2019-08-22: qty 2

## 2019-08-22 MED ORDER — MIDAZOLAM HCL 2 MG/2ML IJ SOLN
INTRAMUSCULAR | Status: AC
  Filled 2019-08-22: qty 2

## 2019-08-22 MED ORDER — ONDANSETRON HCL 4 MG/2ML IJ SOLN: 4.0000 mg | Freq: Four times a day (QID) | INTRAMUSCULAR | Status: DC | PRN

## 2019-08-22 MED ORDER — ACETAMINOPHEN 650 MG RE SUPP: 650.0000 mg | Freq: Four times a day (QID) | RECTAL | Status: DC | PRN

## 2019-08-22 MED ORDER — TRAMADOL HCL 50 MG PO TABS: 50.0000 mg | ORAL_TABLET | Freq: Four times a day (QID) | ORAL | 0 refills | Status: DC | PRN

## 2019-08-22 MED ORDER — LIDOCAINE 2% (20 MG/ML) 5 ML SYRINGE
INTRAMUSCULAR | Status: DC | PRN
  Administered 2019-08-22: 100 mg via INTRAVENOUS

## 2019-08-22 MED ORDER — PROPOFOL 500 MG/50ML IV EMUL
INTRAVENOUS | Status: AC
  Filled 2019-08-22: qty 50

## 2019-08-22 MED ORDER — ACETAMINOPHEN 325 MG PO TABS: 650.0000 mg | ORAL_TABLET | Freq: Four times a day (QID) | ORAL | Status: DC | PRN

## 2019-08-22 MED ORDER — PROPOFOL 10 MG/ML IV BOLUS
INTRAVENOUS | Status: AC
  Filled 2019-08-22: qty 20

## 2019-08-22 MED ORDER — IOHEXOL 300 MG/ML  SOLN
INTRAMUSCULAR | Status: DC | PRN
  Administered 2019-08-22: 9 mL

## 2019-08-22 MED ORDER — DEXAMETHASONE SODIUM PHOSPHATE 10 MG/ML IJ SOLN
INTRAMUSCULAR | Status: DC | PRN
  Administered 2019-08-22: 10 mg via INTRAVENOUS

## 2019-08-22 MED ORDER — HYDROMORPHONE HCL 1 MG/ML IJ SOLN: 1.0000 mg | INTRAMUSCULAR | Status: DC | PRN

## 2019-08-22 MED ORDER — OXYCODONE HCL 5 MG/5ML PO SOLN: 5.0000 mg | Freq: Once | ORAL | Status: DC | PRN

## 2019-08-22 MED ORDER — ROCURONIUM BROMIDE 10 MG/ML (PF) SYRINGE
PREFILLED_SYRINGE | INTRAVENOUS | Status: AC
  Filled 2019-08-22: qty 10

## 2019-08-22 MED ORDER — BUPIVACAINE-EPINEPHRINE 0.5% -1:200000 IJ SOLN
INTRAMUSCULAR | Status: DC | PRN
  Administered 2019-08-22: 30 mL

## 2019-08-22 MED ORDER — TRAMADOL HCL 50 MG PO TABS: 50.0000 mg | ORAL_TABLET | Freq: Four times a day (QID) | ORAL | Status: DC | PRN

## 2019-08-22 SURGICAL SUPPLY — 38 items
APPLIER CLIP ROT 10 11.4 M/L (STAPLE) ×3
CABLE HIGH FREQUENCY MONO STRZ (ELECTRODE) ×3 IMPLANT
CHLORAPREP W/TINT 26 (MISCELLANEOUS) ×6 IMPLANT
CLIP APPLIE ROT 10 11.4 M/L (STAPLE) ×1 IMPLANT
CLOSURE WOUND 1/2 X4 (GAUZE/BANDAGES/DRESSINGS)
COVER MAYO STAND STRL (DRAPES) ×3 IMPLANT
COVER SURGICAL LIGHT HANDLE (MISCELLANEOUS) ×3 IMPLANT
COVER WAND RF STERILE (DRAPES) IMPLANT
DECANTER SPIKE VIAL GLASS SM (MISCELLANEOUS) ×3 IMPLANT
DERMABOND ADVANCED (GAUZE/BANDAGES/DRESSINGS) ×2
DERMABOND ADVANCED .7 DNX12 (GAUZE/BANDAGES/DRESSINGS) ×1 IMPLANT
DRAPE C-ARM 42X120 X-RAY (DRAPES) ×3 IMPLANT
ELECT REM PT RETURN 15FT ADLT (MISCELLANEOUS) ×3 IMPLANT
GAUZE SPONGE 2X2 8PLY STRL LF (GAUZE/BANDAGES/DRESSINGS) ×1 IMPLANT
GLOVE SURG ORTHO 8.0 STRL STRW (GLOVE) ×3 IMPLANT
GOWN STRL REUS W/TWL XL LVL3 (GOWN DISPOSABLE) ×6 IMPLANT
HEMOSTAT SURGICEL 4X8 (HEMOSTASIS) IMPLANT
KIT BASIN OR (CUSTOM PROCEDURE TRAY) ×3 IMPLANT
KIT TURNOVER KIT A (KITS) IMPLANT
PENCIL SMOKE EVACUATOR (MISCELLANEOUS) IMPLANT
POUCH SPECIMEN RETRIEVAL 10MM (ENDOMECHANICALS) ×3 IMPLANT
SCISSORS LAP 5X35 DISP (ENDOMECHANICALS) ×3 IMPLANT
SET CHOLANGIOGRAPH MIX (MISCELLANEOUS) ×3 IMPLANT
SET IRRIG TUBING LAPAROSCOPIC (IRRIGATION / IRRIGATOR) ×3 IMPLANT
SET TUBE SMOKE EVAC HIGH FLOW (TUBING) IMPLANT
SLEEVE XCEL OPT CAN 5 100 (ENDOMECHANICALS) ×3 IMPLANT
SPONGE GAUZE 2X2 STER 10/PKG (GAUZE/BANDAGES/DRESSINGS) ×2
STRIP CLOSURE SKIN 1/2X4 (GAUZE/BANDAGES/DRESSINGS) IMPLANT
SUT MNCRL AB 4-0 PS2 18 (SUTURE) ×3 IMPLANT
TAPE STRIPS DRAPE STRL (GAUZE/BANDAGES/DRESSINGS) ×3 IMPLANT
TAPE SURG TRANSPORE 1 IN (GAUZE/BANDAGES/DRESSINGS) ×1 IMPLANT
TAPE SURGICAL TRANSPORE 1 IN (GAUZE/BANDAGES/DRESSINGS) ×2
TOWEL OR 17X26 10 PK STRL BLUE (TOWEL DISPOSABLE) ×3 IMPLANT
TOWEL OR NON WOVEN STRL DISP B (DISPOSABLE) ×3 IMPLANT
TRAY LAPAROSCOPIC (CUSTOM PROCEDURE TRAY) ×3 IMPLANT
TROCAR BLADELESS OPT 5 100 (ENDOMECHANICALS) ×3 IMPLANT
TROCAR XCEL BLUNT TIP 100MML (ENDOMECHANICALS) ×3 IMPLANT
TROCAR XCEL NON-BLD 11X100MML (ENDOMECHANICALS) ×3 IMPLANT

## 2019-08-22 NOTE — Progress Notes (Signed)
Discharge and medcation instructions reviewed with patient. Questions answered and patient has no further questions. No prescriptions given. Spouse is driving patient home. Donne Hazel, RN

## 2019-08-22 NOTE — Op Note (Signed)
Procedure Note  Pre-operative Diagnosis:  Chronic cholecystitis, cholelithiasis  Post-operative Diagnosis:  same  Surgeon:  Armandina Gemma, MD  Assistant:  none   Procedure:  Laparoscopic cholecystectomy with intra-operative cholangiography  Anesthesia:  General  Estimated Blood Loss:  minimal  Drains: none         Specimen: gallbladder to pathology  Indications:  Patient is referred by Dr. Scarlette Shorts for surgical evaluation and management of symptomatic cholelithiasis and chronic cholecystitis. Patient had a significant episode of epigastric abdominal pain in September 2020. He was evaluated in the emergency department. Cardiac assessment was negative. Patient had had a previous episode approximately 3 months earlier. Patient underwent ultrasound examination on April 10, 2019. This showed a 2.1 cm gallstone within the gallbladder. Gallbladder wall was irregular and slightly thickened. There was no signs of acute inflammation or infection. Patient has had no prior history of hepatobiliary or pancreatic disease. He denies jaundice or acholic stools. He has had nausea associated with his attacks but no emesis. He has had no prior abdominal surgery. There is a family history of gallbladder disease and the patient's mother and his sister. Both of them required cholecystectomy. Patient presents today for cholecystectomy.   Procedure Details:  The patient was seen in the pre-op holding area. The risks, benefits, complications, treatment options, and expected outcomes were previously discussed with the patient. The patient agreed with the proposed plan and has signed the informed consent form.  The patient was transported to operating room # 1 at the [facility]. The patient was placed in the supine position on the operating room table. Following induction of general anesthesia, the abdomen was prepped and draped in the usual aseptic fashion.  An incision was made in the skin  near the umbilicus. The midline fascia was incised and the peritoneal cavity was entered and a Hasson cannula was introduced under direct vision. The cannula was secured with a 0-Vicryl pursestring suture. Pneumoperitoneum was established with carbon dioxide. Additional cannulae were introduced under direct vision along the right costal margin in the midline, mid-clavicular line, and anterior axillary line.   The gallbladder was identified and the fundus grasped and retracted cephalad. Adhesions were taken down bluntly and the electrocautery was utilized as needed, taking care not to involve any adjacent structures. The infundibulum was grasped and retracted laterally, exposing the peritoneum overlying the triangle of Calot. The peritoneum was incised and structures exposed with blunt dissection. The cystic duct was clearly identified, bluntly dissected circumferentially, and clipped at the neck of the gallbladder.  An incision was made in the cystic duct and the cholangiogram catheter introduced. The catheter was secured using an ligaclip.  Real-time cholangiography was performed using C-arm fluoroscopy.  There was rapid filling of a normal caliber common bile duct.  There was reflux of contrast into the left and right hepatic ductal systems.  There was free flow distally into the duodenum without filling defect or obstruction.  The catheter was removed from the peritoneal cavity.  The cystic duct was then ligated with ligaclips and divided. The cystic artery was identified, dissected circumferentially, ligated with ligaclips, and divided.  The gallbladder was dissected away from the gallbladder bed using the electrocautery for hemostasis. The gallbladder was completely removed from the liver and placed into an endocatch bag. The gallbladder was removed in the endocatch bag through the umbilical port site and submitted to pathology for review.  The right upper quadrant was irrigated and the gallbladder  bed was inspected. Hemostasis was  achieved with the electrocautery.  Cannulae were removed under direct vision and good hemostasis was noted. Pneumoperitoneum was released and the majority of the carbon dioxide evacuated. The umbilical wound was irrigated and the fascia was then closed with the pursestring suture.  Local anesthetic was infiltrated at all port sites. Skin incisions were closed with 4-0 Monocril subcuticular sutures and Dermabond was applied.  Instrument, sponge, and needle counts were correct at the conclusion of the case.  The patient was awakened from anesthesia and brought to the recovery room in stable condition.  The patient tolerated the procedure well.   Armandina Gemma, MD [facility] Office: [phone]

## 2019-08-22 NOTE — Interval H&P Note (Signed)
History and Physical Interval Note:  08/22/2019 7:05 AM  Aaron Donovan  has presented today for surgery, with the diagnosis of CHRONIC CHOLECYSTITIS, CHOLELITHIASIS, BILIARY COLIC.  The various methods of treatment have been discussed with the patient and family. After consideration of risks, benefits and other options for treatment, the patient has consented to    Procedure(s): LAPAROSCOPIC CHOLECYSTECTOMY WITH INTRAOPERATIVE CHOLANGIOGRAM (N/A)  as a surgical intervention.    The patient's history has been reviewed, patient examined, no change in status, stable for surgery.  I have reviewed the patient's chart and labs.  Questions were answered to the patient's satisfaction.    Armandina Gemma, MD [facility] Office: [place]

## 2019-08-22 NOTE — Transfer of Care (Signed)
Immediate Anesthesia Transfer of Care Note  Patient: Aaron Donovan  Procedure(s) Performed: Procedure(s): LAPAROSCOPIC CHOLECYSTECTOMY WITH INTRAOPERATIVE CHOLANGIOGRAM (N/A)  Patient Location: PACU  Anesthesia Type:General  Level of Consciousness: Alert, Awake, Oriented  Airway & Oxygen Therapy: Patient Spontanous Breathing  Post-op Assessment: Report given to RN  Post vital signs: Reviewed and stable  Last Vitals:  Vitals:   08/22/19 0547 08/22/19 0842  BP: 121/76   Pulse: 69 87  Resp: 16 17  Temp: 36.9 C 36.6 C  SpO2: [id number]    Complications: No apparent anesthesia complications

## 2019-08-22 NOTE — Anesthesia Postprocedure Evaluation (Signed)
Anesthesia Post Note  Patient: Aaron Donovan  Procedure(s) Performed: LAPAROSCOPIC CHOLECYSTECTOMY WITH INTRAOPERATIVE CHOLANGIOGRAM (N/A )     Patient location during evaluation: PACU Anesthesia Type: General Level of consciousness: awake and alert and oriented Pain management: pain level controlled Vital Signs Assessment: post-procedure vital signs reviewed and stable Respiratory status: spontaneous breathing, nonlabored ventilation and respiratory function stable Cardiovascular status: blood pressure returned to baseline Postop Assessment: no apparent nausea or vomiting Anesthetic complications: no    Last Vitals:  Vitals:   08/22/19 1105 08/22/19 1142  BP: 140/79 139/74  Pulse: 70 74  Resp: 16 16  Temp: 36.8 C 36.6 C  SpO2: 96% 95%    Last Pain:  Vitals:   08/22/19 1142  TempSrc: Oral  PainSc:                  Brennan Bailey

## 2019-08-22 NOTE — Anesthesia Procedure Notes (Signed)
Procedure Name: Intubation Date/Time: 08/22/2019 7:23 AM Performed by: Gerald Leitz, CRNA Pre-anesthesia Checklist: Patient identified, Patient being monitored, Timeout performed, Emergency Drugs available and Suction available Patient Re-evaluated:Patient Re-evaluated prior to induction Oxygen Delivery Method: Circle system utilized Preoxygenation: Pre-oxygenation with 100% oxygen Induction Type: IV induction Ventilation: Mask ventilation without difficulty Laryngoscope Size: Mac and 3 Grade View: Grade I Tube type: Oral Tube size: 7.5 mm Number of attempts: 1 Placement Confirmation: ETT inserted through vocal cords under direct vision,  positive ETCO2 and breath sounds checked- equal and bilateral Secured at: 21 cm Tube secured with: Tape Dental Injury: Teeth and Oropharynx as per pre-operative assessment

## 2019-08-23 LAB — SURGICAL PATHOLOGY

## 2020-11-05 ENCOUNTER — Encounter: Payer: Self-pay | Admitting: Internal Medicine

## 2021-07-16 IMAGING — CR DG CHEST 2V
2 series · 2 of 2 positions shown · non-contrast
Comparison: None.

CLINICAL DATA: Epigastric pain

EXAM:
CHEST - 2 VIEW

[chest pa]
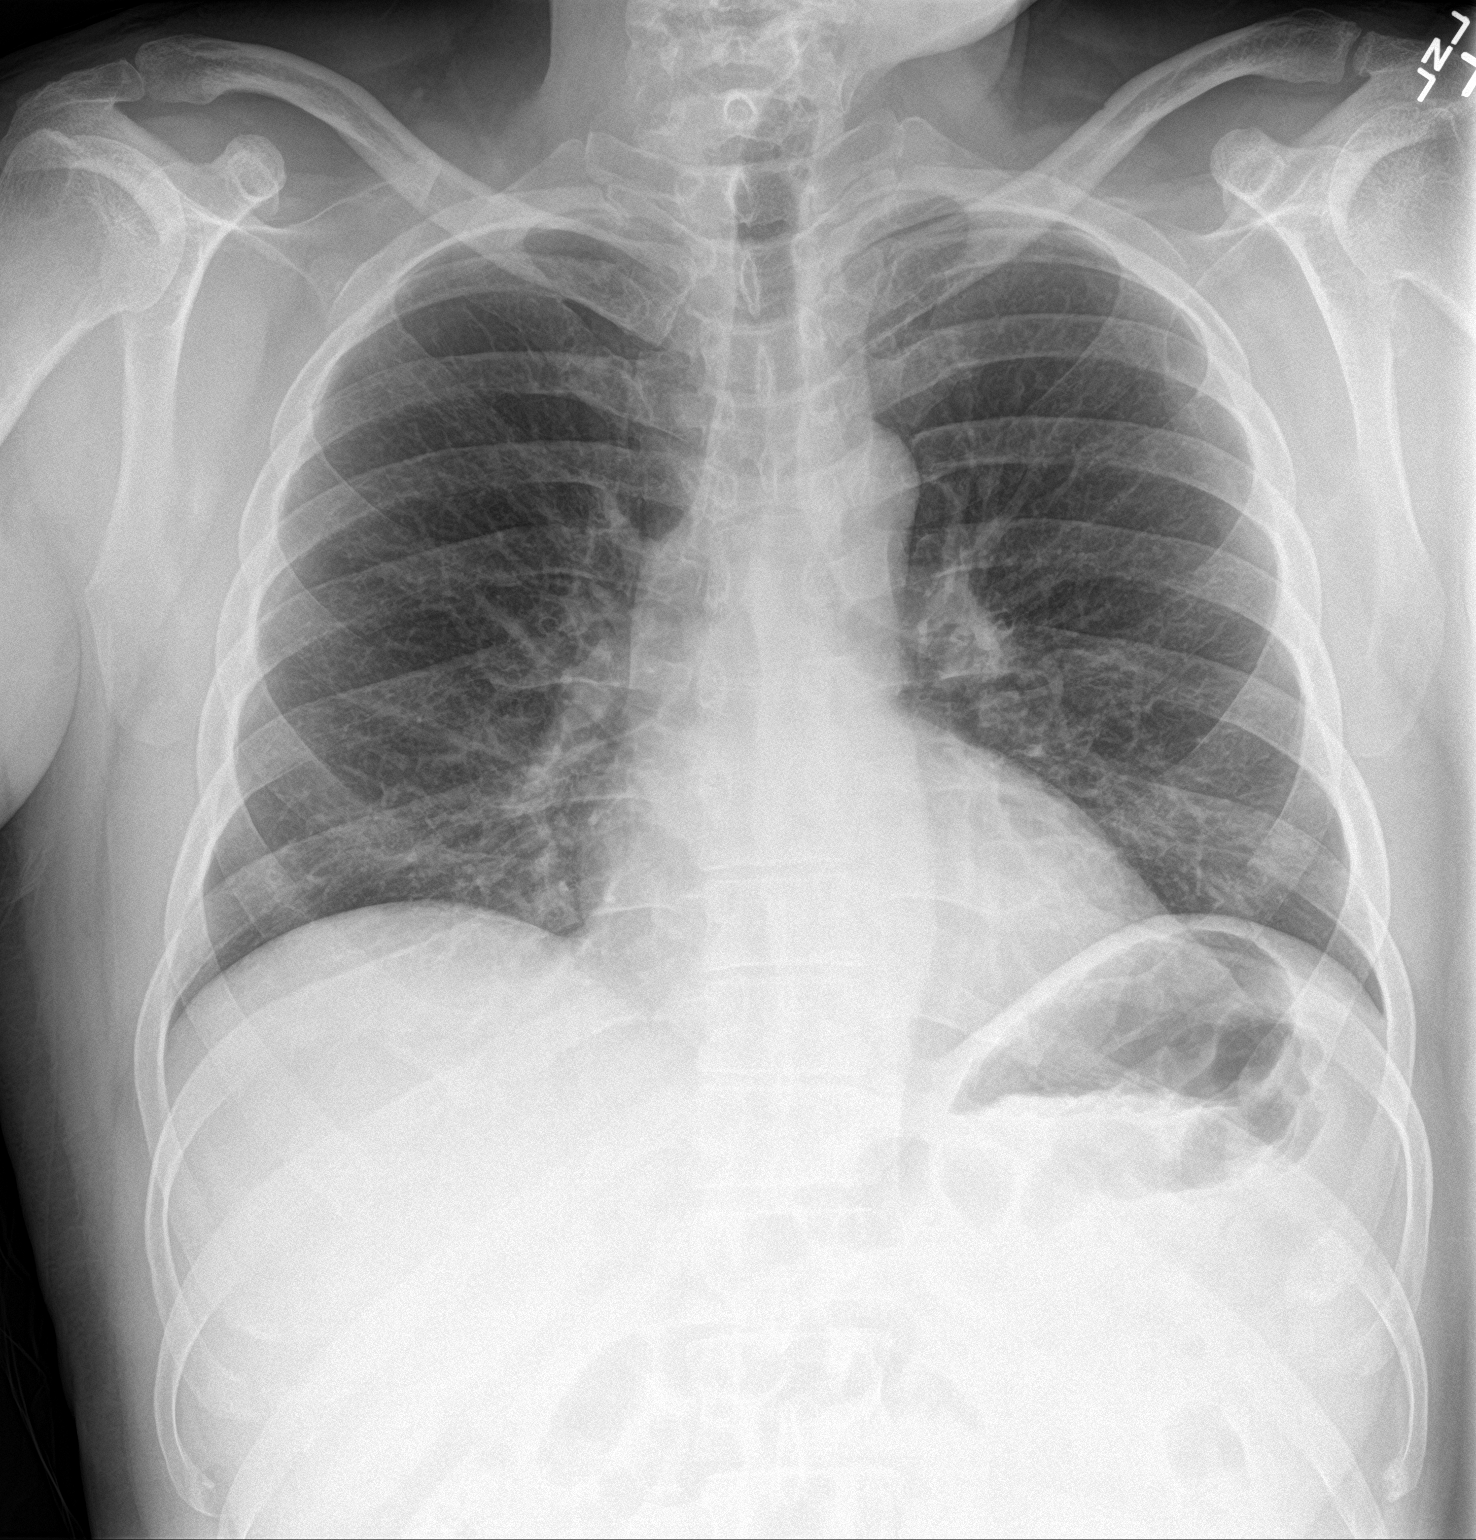

[chest lat]
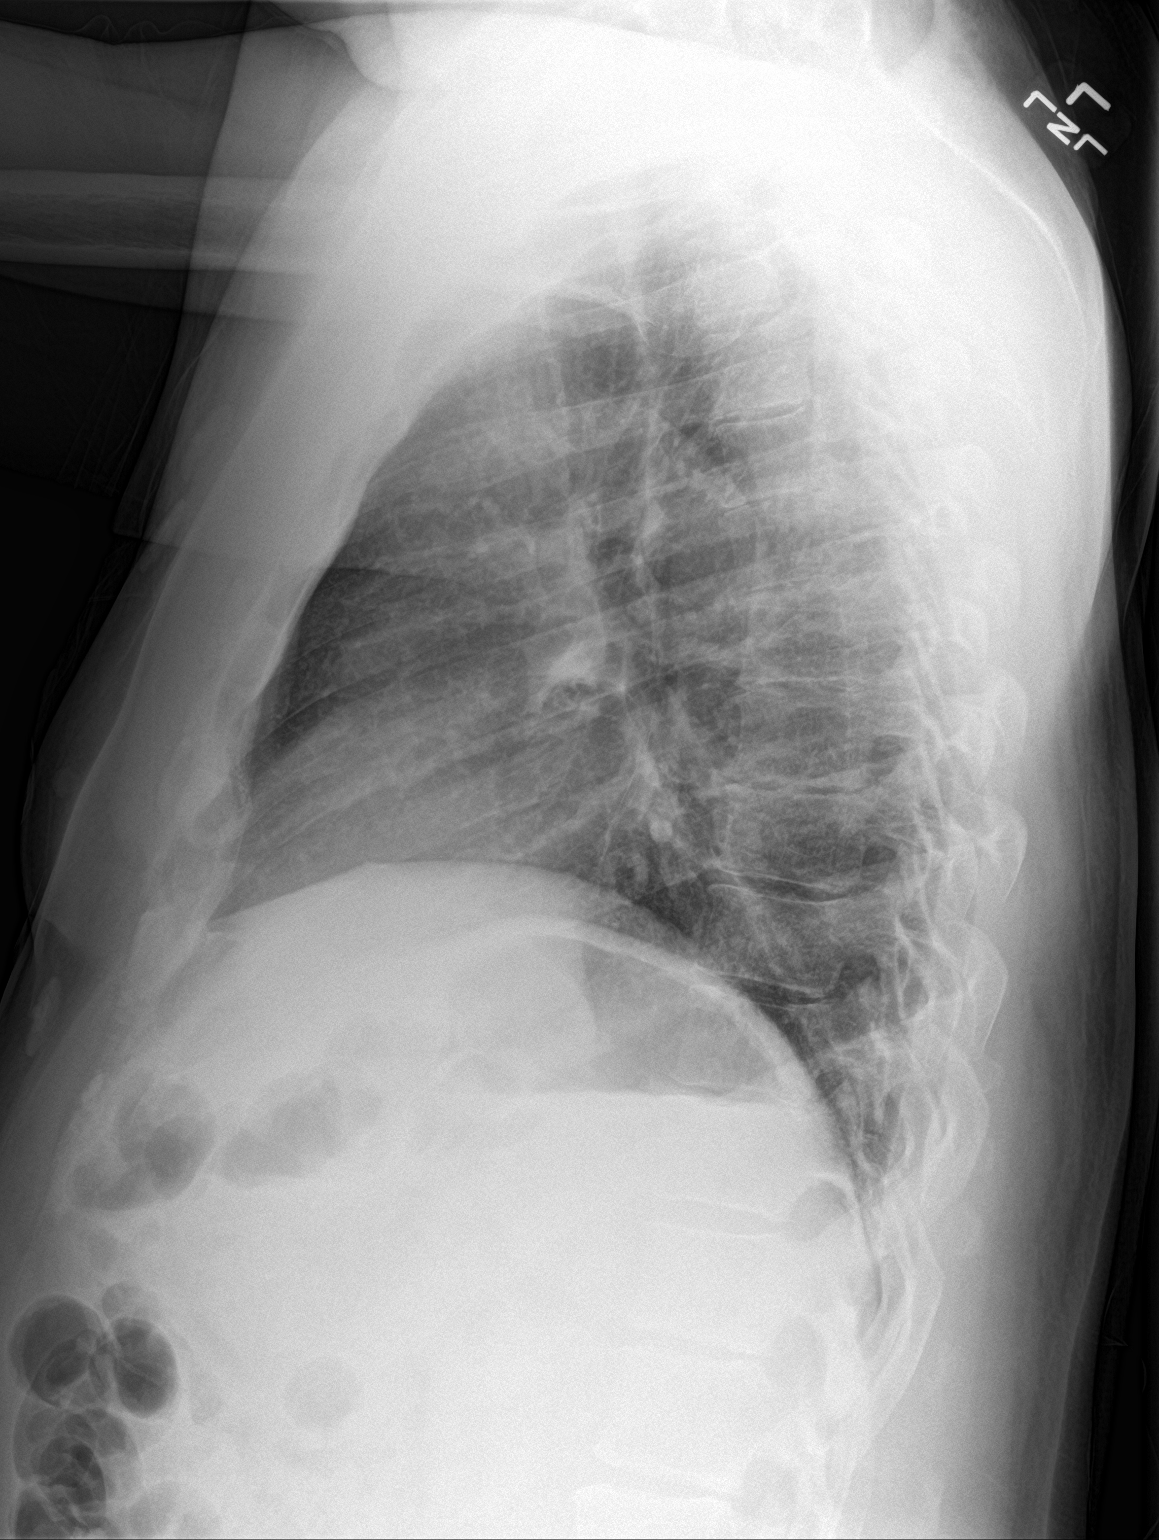

[2 of 2 positions shown; findings below may reference images not displayed]

FINDINGS: Mild basilar atelectatic changes. No consolidation, features of
edema, pneumothorax, or effusion. Pulmonary vascularity is normally
distributed. The cardiomediastinal contours are unremarkable. No
acute osseous or soft tissue abnormality. No evidence of free
subdiaphragmatic air.
IMPRESSION: No acute cardiopulmonary abnormality.

## 2021-07-23 IMAGING — US US ABDOMEN COMPLETE
1 series · 14 of 25 positions shown · non-contrast
Comparison: None.

CLINICAL DATA: Severe epigastric pain.

EXAM:
ABDOMEN ULTRASOUND COMPLETE

[Series 1: us abdomen complete · 0.25mm/px · 14 of 84 slices shown]
[im 1/84]
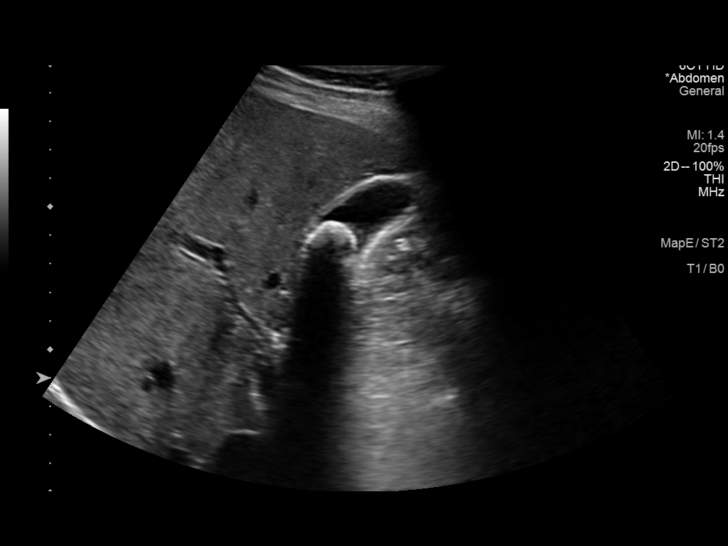
[im 7/84]
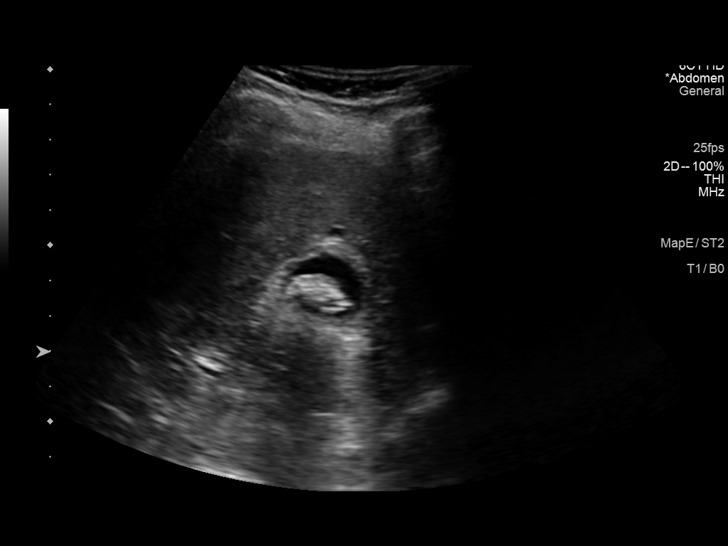
[im 14/84]
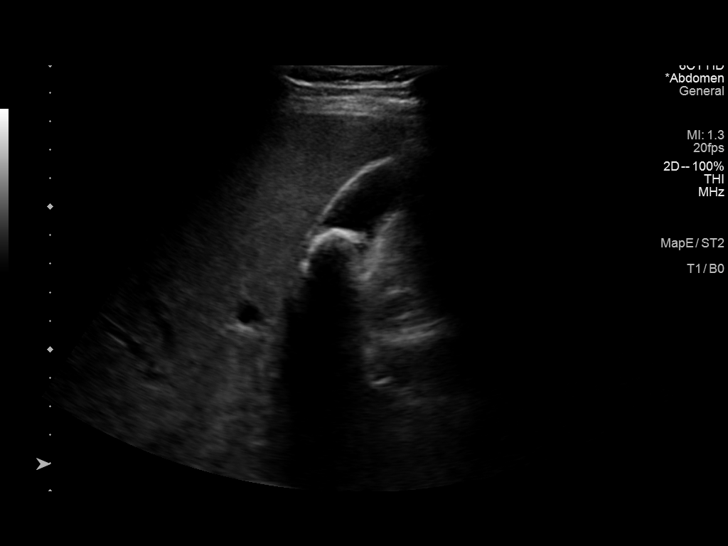
[im 21/84]
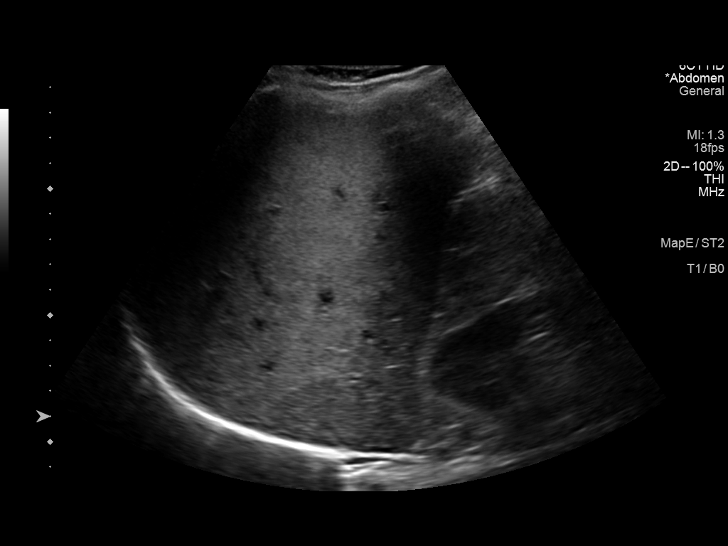
[im 28/84]
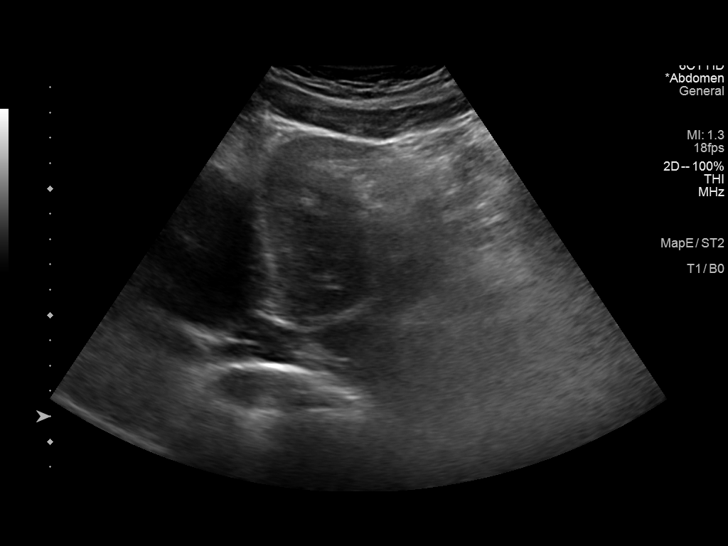
[im 32/84]
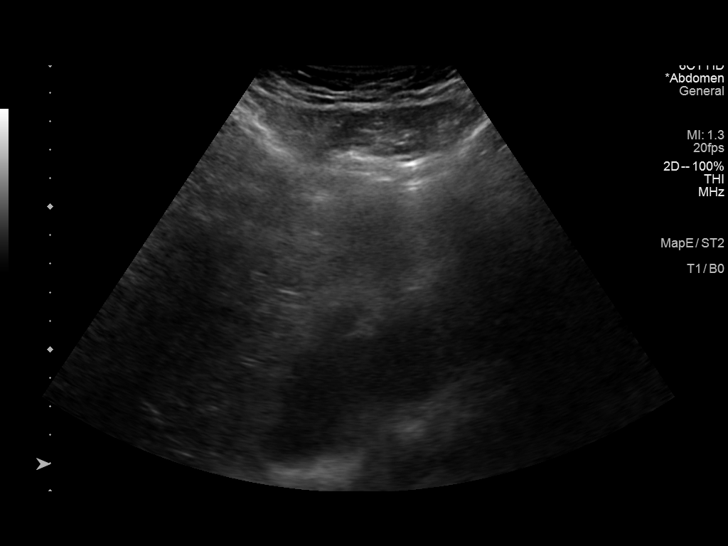
[im 39/84]
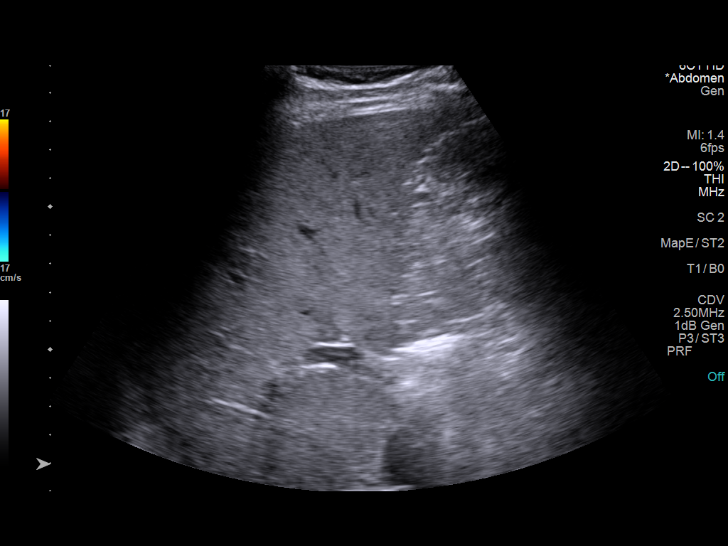
[im 45/84]
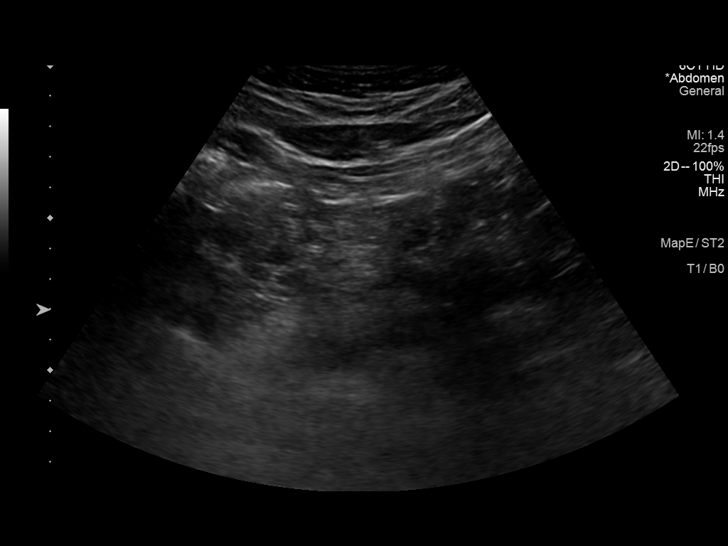
[im 52/84]
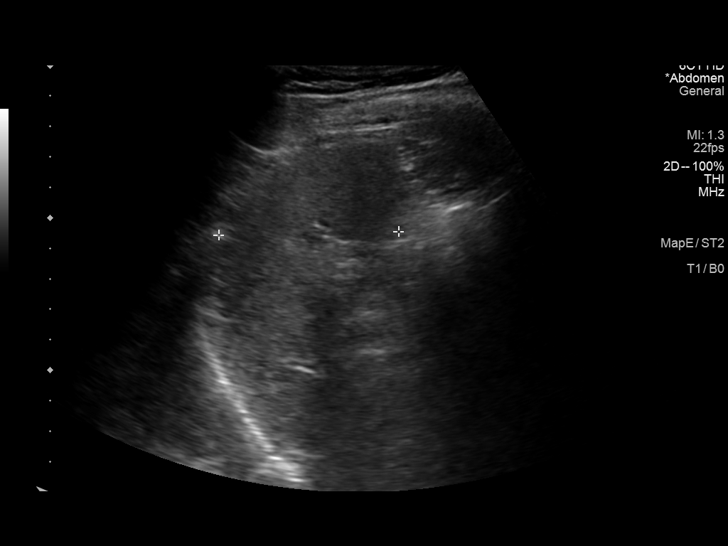
[im 56/84]
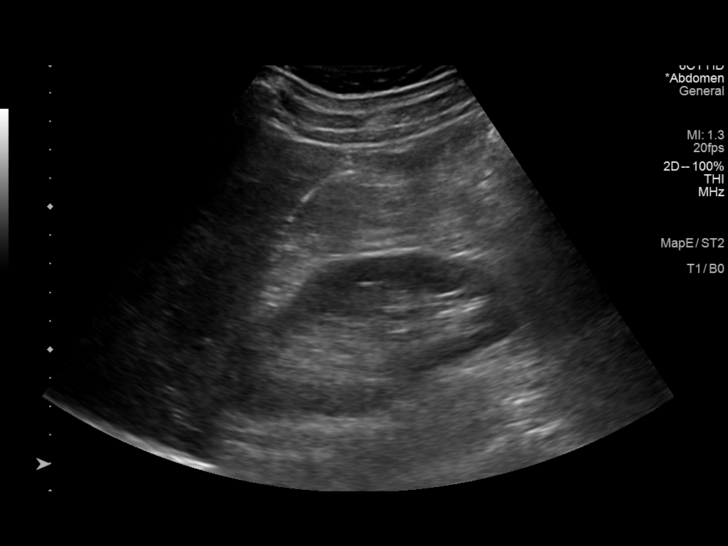
[im 63/84]
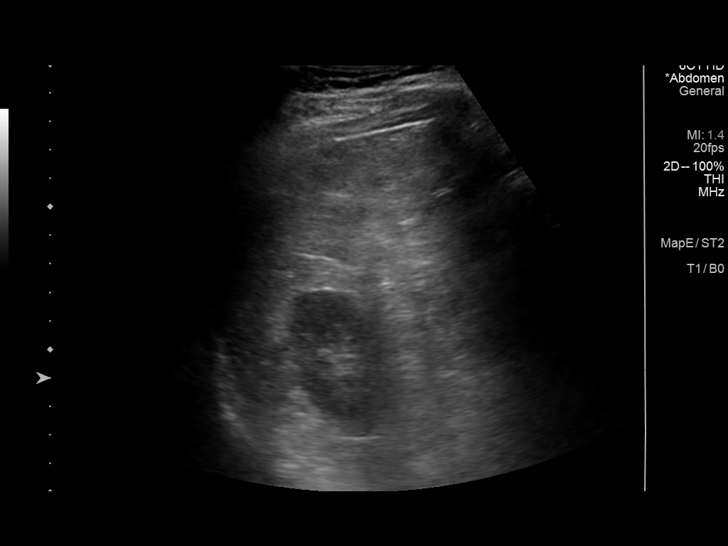
[im 70/84]
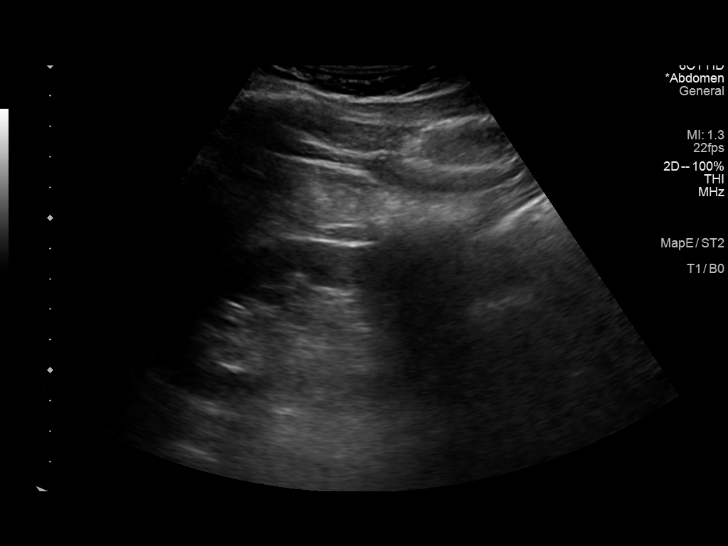
[im 77/84]
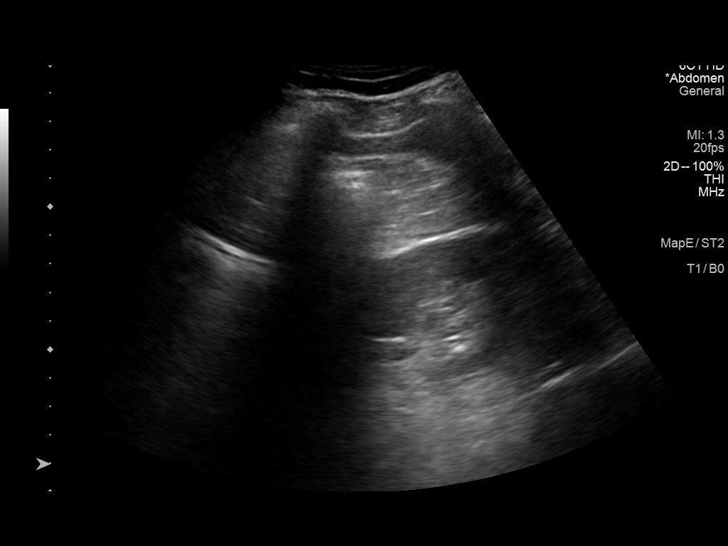
[im 84/84]
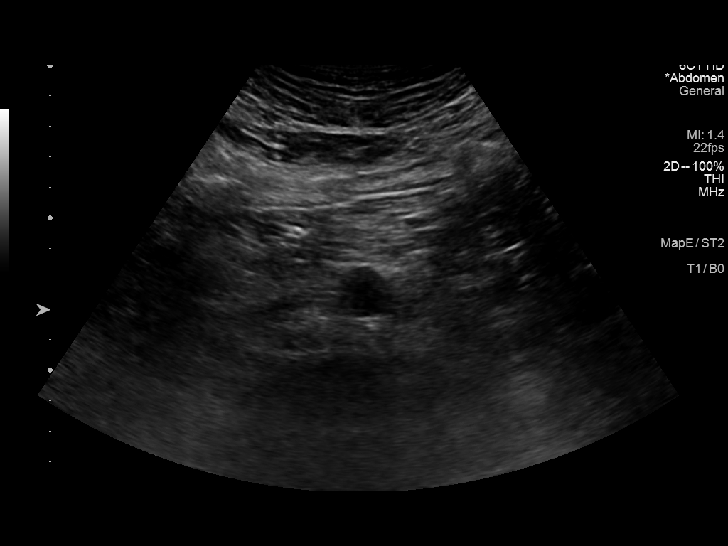

[14 of 25 positions shown; findings below may reference images not displayed]

FINDINGS: Gallbladder: There is a 2.1 cm stone in the gallbladder. The
gallbladder wall is slightly thickened and irregular. Negative
sonographic Murphy's sign.

Common bile duct: Diameter: 2.1 mm, normal.

Liver: No focal lesion identified. Within normal limits in
parenchymal echogenicity. Portal vein is patent on color Doppler
imaging with normal direction of blood flow towards the liver.

IVC: No abnormality visualized.

Pancreas: Visualized portion unremarkable.

Spleen: Size and appearance within normal limits.

Right Kidney: Length: 10.4 cm. Echogenicity within normal limits. No
mass or hydronephrosis visualized.

Left Kidney: Length: 11.2 cm. Echogenicity within normal limits. No
mass or hydronephrosis visualized.

Abdominal aorta: No aneurysm visualized.

Other findings: None.
IMPRESSION: Cholelithiasis.

## 2021-12-21 ENCOUNTER — Encounter: Payer: Self-pay | Admitting: Internal Medicine

## 2022-01-19 ENCOUNTER — Ambulatory Visit (AMBULATORY_SURGERY_CENTER): Payer: BC Managed Care – PPO | Admitting: *Deleted

## 2022-01-19 VITALS — Ht 72.0 in | Wt 202.0 lb

## 2022-01-19 DIAGNOSIS — Z86010 Personal history of colonic polyps: Secondary | ICD-10-CM

## 2022-01-19 MED ORDER — NA SULFATE-K SULFATE-MG SULF 17.5-3.13-1.6 GM/177ML PO SOLN: 1.0000 | Freq: Once | ORAL | 0 refills | Status: AC

## 2022-01-19 NOTE — Progress Notes (Signed)
No egg or soy allergy known to patient  No issues known to pt with past sedation with any surgeries or procedures Patient denies ever being told they had issues or difficulty with intubation  No FH of Malignant Hyperthermia Pt is not on diet pills Pt is not on  home 02  Pt is not on blood thinners  Pt denies issues with constipation  No A fib or A flutter Have any cardiac testing pending--denies Pt instructed to use [employer] or GoodRx for a price reduction on prep

## 2022-02-04 ENCOUNTER — Encounter: Payer: Self-pay | Admitting: Internal Medicine

## 2022-02-09 ENCOUNTER — Encounter: Payer: Self-pay | Admitting: Internal Medicine

## 2022-02-09 ENCOUNTER — Ambulatory Visit (AMBULATORY_SURGERY_CENTER): Payer: BC Managed Care – PPO | Admitting: Internal Medicine

## 2022-02-09 VITALS — BP 108/66 | HR 53 | Temp 98.2°F | Resp 16

## 2022-02-09 DIAGNOSIS — Z86010 Personal history of colonic polyps: Secondary | ICD-10-CM

## 2022-02-09 DIAGNOSIS — D124 Benign neoplasm of descending colon: Secondary | ICD-10-CM

## 2022-02-09 DIAGNOSIS — K635 Polyp of colon: Secondary | ICD-10-CM | POA: Diagnosis not present

## 2022-02-09 DIAGNOSIS — Z09 Encounter for follow-up examination after completed treatment for conditions other than malignant neoplasm: Secondary | ICD-10-CM

## 2022-02-09 MED ORDER — SODIUM CHLORIDE 0.9 % IV SOLN: 500.0000 mL | Freq: Once | INTRAVENOUS | Status: DC

## 2022-02-09 NOTE — Patient Instructions (Signed)
Await pathology results.  Handouts on polyps and hemorrhoids provided.  YOU HAD AN ENDOSCOPIC PROCEDURE TODAY AT [facility]:   Refer to the procedure report that was given to you for any specific questions about what was found during the examination.  If the procedure report does not answer your questions, please call your gastroenterologist to clarify.  If you requested that your care partner not be given the details of your procedure findings, then the procedure report has been included in a sealed envelope for you to review at your convenience later.  YOU SHOULD EXPECT: Some feelings of bloating in the abdomen. Passage of more gas than usual.  Walking can help get rid of the air that was put into your GI tract during the procedure and reduce the bloating. If you had a lower endoscopy (such as a colonoscopy or flexible sigmoidoscopy) you may notice spotting of blood in your stool or on the toilet paper. If you underwent a bowel prep for your procedure, you may not have a normal bowel movement for a few days.  Please Note:  You might notice some irritation and congestion in your nose or some drainage.  This is from the oxygen used during your procedure.  There is no need for concern and it should clear up in a day or so.  SYMPTOMS TO REPORT IMMEDIATELY:  Following lower endoscopy (colonoscopy or flexible sigmoidoscopy):  Excessive amounts of blood in the stool  Significant tenderness or worsening of abdominal pains  Swelling of the abdomen that is new, acute  Fever of 100F or higher    For urgent or emergent issues, a gastroenterologist can be reached at any hour by calling [phone]. Do not use MyChart messaging for urgent concerns.    DIET:  We do recommend a small meal at first, but then you may proceed to your regular diet.  Drink plenty of fluids but you should avoid alcoholic beverages for 24 hours.  ACTIVITY:  You should plan to take it easy for the rest of  today and you should NOT DRIVE or use heavy machinery until tomorrow (because of the sedation medicines used during the test).    FOLLOW UP: Our staff will call the number listed on your records the next business day following your procedure.  We will call around 7:15- 8:00 am to check on you and address any questions or concerns that you may have regarding the information given to you following your procedure. If we do not reach you, we will leave a message.  If you develop any symptoms (ie: fever, flu-like symptoms, shortness of breath, cough etc.) before then, please call [phone].  If you test positive for Covid 19 in the 2 weeks post procedure, please call and report this information to [place].    If any biopsies were taken you will be contacted by phone or by letter within the next 1-3 weeks.  Please call us at [phone] if you have not heard about the biopsies in 3 weeks.    SIGNATURES/CONFIDENTIALITY: You and/or your care partner have signed paperwork which will be entered into your electronic medical record.  These signatures attest to the fact that that the information above on your After Visit Summary has been reviewed and is understood.  Full responsibility of the confidentiality of this discharge information lies with you and/or your care-partner.

## 2022-02-09 NOTE — Progress Notes (Signed)
HISTORY OF PRESENT ILLNESS:  Aaron Donovan is a 61 y.o. male who presents today for surveillance colonoscopy.  He has a history of multiple adenomatous colon polyps with previous examinations 2010 and 2013.  No active complaints.  Tolerated prep  REVIEW OF SYSTEMS:  All non-GI ROS negative. Past Medical History:  Diagnosis Date   Allergy    Gallstones    Hyperlipidemia    Sleep apnea    wears CPAP    Past Surgical History:  Procedure Laterality Date   CHOLECYSTECTOMY N/A 08/22/2019   Procedure: LAPAROSCOPIC CHOLECYSTECTOMY WITH INTRAOPERATIVE CHOLANGIOGRAM;  Surgeon: Armandina Gemma, MD;  Location: [facility];  Service: General;  Laterality: N/A;   COLONOSCOPY     COLONOSCOPY W/ POLYPECTOMY      Social History [name]  reports that he has never smoked. He has never used smokeless tobacco. He reports current alcohol use of about 2.0 standard drinks of alcohol per week. He reports that he does not use drugs.  family history includes Diabetes Mellitus II in his mother and sister; Hypertension in his mother and sister; Obesity in his brother; Rheumatic fever (age of onset: 74) in his father.  No Known Allergies     PHYSICAL EXAMINATION: Vital signs: Temp 98.2 F (36.8 C)  General: Well-developed, well-nourished, no acute distress HEENT: Sclerae are anicteric, conjunctiva pink. Oral mucosa intact Lungs: Clear Heart: Regular Abdomen: soft, nontender, nondistended, no obvious ascites, no peritoneal signs, normal bowel sounds. No organomegaly. Extremities: No edema Psychiatric: alert and oriented x3. Cooperative      ASSESSMENT:  Personal history of multiple adenomatous colon polyps, due for surveillance   PLAN:  Surveillance colonoscopy

## 2022-02-09 NOTE — Op Note (Signed)
[place] Patient Name: Aaron Donovan Procedure Date: 02/09/2022 8:41 AM MRN: [id number] Endoscopist: Docia Chuck. Henrene Pastor , MD Age: 61 Referring MD:  Date of Birth: [birth year] Gender: Male Account #: [id number] Procedure:                Colonoscopy with cold snare polypectomy x 1 Indications:              High risk colon cancer surveillance: Personal                            history of multiple (3 or more) adenomas. Previous                            examinations 2010, 2017 Medicines:                Monitored Anesthesia Care Procedure:                Pre-Anesthesia Assessment:                           - Prior to the procedure, a History and Physical                            was performed, and patient medications and                            allergies were reviewed. The patient's tolerance of                            previous anesthesia was also reviewed. The risks                            and benefits of the procedure and the sedation                            options and risks were discussed with the patient.                            All questions were answered, and informed consent                            was obtained. Prior Anticoagulants: The patient has                            taken no previous anticoagulant or antiplatelet                            agents. ASA Grade Assessment: II - A patient with                            mild systemic disease. After reviewing the risks                            and benefits, the patient was deemed in  satisfactory condition to undergo the procedure.                           After obtaining informed consent, the colonoscope                            was passed under direct vision. Throughout the                            procedure, the patient's blood pressure, pulse, and                            oxygen saturations were monitored continuously. The                            CF [id number]  #[id number] was introduced through the anus                            and advanced to the the cecum, identified by                            appendiceal orifice and ileocecal valve. The                            ileocecal valve, appendiceal orifice, and rectum                            were photographed. The quality of the bowel                            preparation was excellent. The colonoscopy was                            performed without difficulty. The patient tolerated                            the procedure well. The bowel preparation used was                            SUPREP via split dose instruction. Scope In: 8:51:54 AM Scope Out: 9:05:53 AM Scope Withdrawal Time: 0 hours 12 minutes 9 seconds  Total Procedure Duration: 0 hours 13 minutes 59 seconds  Findings:                 A 2 mm polyp was found in the descending colon. The                            polyp was removed with a cold snare. Resection and                            retrieval were complete.                           Internal hemorrhoids were found during retroflexion.  The exam was otherwise without abnormality on                            direct and retroflexion views. Complications:            No immediate complications. Estimated blood loss:                            None. Estimated Blood Loss:     Estimated blood loss: none. Impression:               - One 2 mm polyp in the descending colon, removed                            with a cold snare. Resected and retrieved.                           - Internal hemorrhoids.                           - The examination was otherwise normal on direct                            and retroflexion views. Recommendation:           - Repeat colonoscopy in 5 years for surveillance                            (personal history of multiple adenomas).                           - Patient has a contact number available for                             emergencies. The signs and symptoms of potential                            delayed complications were discussed with the                            patient. Return to normal activities tomorrow.                            Written discharge instructions were provided to the                            patient.                           - Resume previous diet.                           - Continue present medications.                           - Await pathology results. Docia Chuck. Henrene Pastor, MD 02/09/2022 9:11:10 AM This report has been signed electronically.

## 2022-02-09 NOTE — Progress Notes (Signed)
Called to room to assist during endoscopic procedure.  Patient ID and intended procedure confirmed with present staff. Received instructions for my participation in the procedure from the performing physician.  

## 2022-02-09 NOTE — Progress Notes (Signed)
Pt's states no medical or surgical changes since previsit or office visit. 

## 2022-02-09 NOTE — Progress Notes (Signed)
Sedate, gd SR, tolerated procedure well, VSS, report to RN 

## 2022-02-10 ENCOUNTER — Telehealth: Payer: Self-pay

## 2022-02-10 NOTE — Telephone Encounter (Signed)
  Follow up Call-     02/09/2022    7:41 AM  Call back number  Post procedure Call Back phone  # [phone]  Permission to leave phone message Yes    Left message

## 2022-02-11 ENCOUNTER — Encounter: Payer: Self-pay | Admitting: Internal Medicine

## 2024-06-10 ENCOUNTER — Other Ambulatory Visit: Payer: Self-pay | Admitting: Medical Genetics

## 2024-08-01 ENCOUNTER — Other Ambulatory Visit

## 2024-08-15 ENCOUNTER — Other Ambulatory Visit
